# Patient Record
Sex: Male | Born: 1950 | Race: White | Hispanic: No | Marital: Married | Smoking: Former smoker
Health system: Southern US, Community
[De-identification: ages and names within clinical notes are randomized; demographics above are authoritative.]

## PROBLEM LIST (undated history)

## (undated) DIAGNOSIS — N189 Chronic kidney disease, unspecified: Secondary | ICD-10-CM

## (undated) DIAGNOSIS — E119 Type 2 diabetes mellitus without complications: Secondary | ICD-10-CM

## (undated) DIAGNOSIS — I10 Essential (primary) hypertension: Secondary | ICD-10-CM

## (undated) DIAGNOSIS — E785 Hyperlipidemia, unspecified: Secondary | ICD-10-CM

## (undated) HISTORY — DX: Chronic kidney disease, unspecified: N18.9

## (undated) HISTORY — DX: Essential (primary) hypertension: I10

## (undated) HISTORY — DX: Type 2 diabetes mellitus without complications: E11.9

## (undated) HISTORY — DX: Hyperlipidemia, unspecified: E78.5

---

## 2020-06-01 ENCOUNTER — Encounter: Payer: Self-pay | Admitting: Family Medicine

## 2020-06-01 ENCOUNTER — Other Ambulatory Visit: Payer: Self-pay

## 2020-06-01 ENCOUNTER — Ambulatory Visit (INDEPENDENT_AMBULATORY_CARE_PROVIDER_SITE_OTHER): Payer: Medicare HMO | Admitting: Family Medicine

## 2020-06-01 DIAGNOSIS — E1121 Type 2 diabetes mellitus with diabetic nephropathy: Secondary | ICD-10-CM

## 2020-06-01 DIAGNOSIS — N183 Chronic kidney disease, stage 3 unspecified: Secondary | ICD-10-CM | POA: Insufficient documentation

## 2020-06-01 DIAGNOSIS — E785 Hyperlipidemia, unspecified: Secondary | ICD-10-CM

## 2020-06-01 DIAGNOSIS — N1832 Chronic kidney disease, stage 3b: Secondary | ICD-10-CM | POA: Diagnosis not present

## 2020-06-01 DIAGNOSIS — R21 Rash and other nonspecific skin eruption: Secondary | ICD-10-CM

## 2020-06-01 DIAGNOSIS — R351 Nocturia: Secondary | ICD-10-CM | POA: Diagnosis not present

## 2020-06-01 DIAGNOSIS — I10 Essential (primary) hypertension: Secondary | ICD-10-CM | POA: Diagnosis not present

## 2020-06-01 DIAGNOSIS — E119 Type 2 diabetes mellitus without complications: Secondary | ICD-10-CM | POA: Insufficient documentation

## 2020-06-01 DIAGNOSIS — E1122 Type 2 diabetes mellitus with diabetic chronic kidney disease: Secondary | ICD-10-CM

## 2020-06-01 DIAGNOSIS — E559 Vitamin D deficiency, unspecified: Secondary | ICD-10-CM | POA: Insufficient documentation

## 2020-06-01 MED ORDER — BENAZEPRIL HCL 10 MG PO TABS
10.0000 mg | ORAL_TABLET | Freq: Every day | ORAL | 1 refills | Status: DC
Start: 2020-06-01 — End: 2020-06-13

## 2020-06-01 NOTE — Progress Notes (Signed)
Office Visit Note   Patient: Craig Baldwin           Date of Birth: 11-29-1951           MRN: [id number] Visit Date: 06/01/2020 Requested by: No referring provider defined for this encounter. PCP: No primary care provider on file.  Subjective: Chief Complaint  Patient presents with  . Chronic Kidney Disease    stage 3 kidney disease, make sure he is on the meds that he should be taking  . Skin Problem    areas that start itching and he has to scratch them, uses ammonium lactate lotion 12% and triamcinolone Acetonide 0.1%-on hands and arms    HPI: He is here to establish care.  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  He wanted to meet with me to discuss his health and different ways to manage with lifestyle.  He has been diabetic for quite a while.  Diabetes has been well controlled.  He was on Metformin for many years but then in 2016 he developed chronic kidney disease stage III and has been off Metformin.  He is managing with diet and his most recent A1c was 5.8.  No nephropathy or retinopathy.  He has been on benazepril primarily for renal protection rather than hypertension.  He has been on this for many years as well.  He was started on a statin last year but was concerned about side effects so he stopped it about 4 months ago.  He has developed a rash on his arms.  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  A dermatologist gave him triamcinolone and ammonium lactate creams for this.  From a dietary standpoint he has tried to eliminate grains as much as possible.  He was able to lose weight quite a bit until Thanksgiving and then he "fell off the wagon".  He struggles with nocturia as well as daytime urinary frequency.  Flomax has not really helped.  He has vitamin D deficiency and is not currently being treated for  that.               ROS:   All other systems were reviewed and are negative.  Objective: Vital Signs: BP 115/70 (BP Location: Left Arm, Patient Position: Sitting)   Pulse 60   Ht 5\' 11"  (1.803 m)   Wt 225 lb 12.8 oz (102.4 kg)   BMI 31.49 kg/m   Physical Exam:  General:  Alert and oriented, in no acute distress. Pulm:  Breathing unlabored. Psy:  Normal mood, congruent affect. Skin: There is some bruising on his forearms on the dorsum.  Legs have no rash. Neck: No thyromegaly or nodules, no carotid bruits. CV: Regular rate and rhythm without murmurs, rubs, or gallops.  No peripheral edema.  2+ radial and posterior tibial pulses. Lungs: Clear to auscultation throughout with no wheezing or areas of consolidation. Abd: Bowel sounds are active, no hepatosplenomegaly or masses.  Soft and nontender.  No audible bruits.  No evidence of ascites. Extremities: No nail deformities.  2+ DTRs.   Imaging: No results found.  Assessment & Plan: 1.  Diabetes, has been well controlled -Labs to evaluate.  2.  Possible hypertension -Blood pressure normal on benazepril  3.  Chronic kidney disease stage III -Avoid NSAIDs.  Recheck levels today.  4.  Hyperlipidemia -  We will check a fractionated lipid profile.  5.  Vitamin D deficiency -Recheck levels today.  6.  Rash on forearms -We will try a food elimination diet.     Procedures: No procedures performed  No notes on file     PMFS History: Patient Active Problem List   Diagnosis Date Noted  . Diabetes ([place]) 06/01/2020  . CKD (chronic kidney disease), stage III 06/01/2020  . Nocturia 06/01/2020  . Essential hypertension 06/01/2020  . Hyperlipidemia 06/01/2020  . Rash 06/01/2020  . Vitamin D deficiency 06/01/2020   History reviewed. No pertinent past medical history.  History reviewed. No pertinent family history.  History reviewed. No pertinent surgical history. Social History   Occupational History  . Not on file    Tobacco Use  . Smoking status: Not on file  Substance and Sexual Activity  . Alcohol use: Not on file  . Drug use: Not on file  . Sexual activity: Not on file

## 2020-06-04 ENCOUNTER — Telehealth: Payer: Self-pay | Admitting: Family Medicine

## 2020-06-04 LAB — COMPREHENSIVE METABOLIC PANEL
AG Ratio: 1.7 (calc) (ref 1.0–2.5)
ALT: 11 U/L (ref 9–46)
AST: 14 U/L (ref 10–35)
Albumin: 4.3 g/dL (ref 3.6–5.1)
Alkaline phosphatase (APISO): 52 U/L (ref 35–144)
BUN/Creatinine Ratio: 17 (calc) (ref 6–22)
BUN: 40 mg/dL — ABNORMAL HIGH (ref 7–25)
CO2: 27 mmol/L (ref 20–32)
Calcium: 9.5 mg/dL (ref 8.6–10.3)
Chloride: 104 mmol/L (ref 98–110)
Creat: 2.39 mg/dL — ABNORMAL HIGH (ref 0.70–1.25)
Globulin: 2.6 g/dL (calc) (ref 1.9–3.7)
Glucose, Bld: 108 mg/dL — ABNORMAL HIGH (ref 65–99)
Potassium: 5.1 mmol/L (ref 3.5–5.3)
Sodium: 137 mmol/L (ref 135–146)
Total Bilirubin: 0.8 mg/dL (ref 0.2–1.2)
Total Protein: 6.9 g/dL (ref 6.1–8.1)

## 2020-06-04 LAB — VITAMIN D 25 HYDROXY (VIT D DEFICIENCY, FRACTURES): Vit D, 25-Hydroxy: 32 ng/mL (ref 30–100)

## 2020-06-04 LAB — CBC WITH DIFFERENTIAL/PLATELET
Absolute Monocytes: 552 cells/uL (ref 200–950)
Basophils Absolute: 28 cells/uL (ref 0–200)
Basophils Relative: 0.4 %
Eosinophils Absolute: 228 cells/uL (ref 15–500)
Eosinophils Relative: 3.3 %
HCT: 39.5 % (ref 38.5–50.0)
Hemoglobin: 13.6 g/dL (ref 13.2–17.1)
Lymphs Abs: 1960 cells/uL (ref 850–3900)
MCH: 31 pg (ref 27.0–33.0)
MCHC: 34.4 g/dL (ref 32.0–36.0)
MCV: 90 fL (ref 80.0–100.0)
MPV: 10.2 fL (ref 7.5–12.5)
Monocytes Relative: 8 %
Neutro Abs: 4133 cells/uL (ref 1500–7800)
Neutrophils Relative %: 59.9 %
Platelets: 201 10*3/uL (ref 140–400)
RBC: 4.39 10*6/uL (ref 4.20–5.80)
RDW: 12.7 % (ref 11.0–15.0)
Total Lymphocyte: 28.4 %
WBC: 6.9 10*3/uL (ref 3.8–10.8)

## 2020-06-04 LAB — PSA: PSA: 0.7 ng/mL (ref <=4.0)

## 2020-06-04 LAB — HEMOGLOBIN A1C
Hgb A1c MFr Bld: 6.4 % of total Hgb — ABNORMAL HIGH (ref <5.7)
Mean Plasma Glucose: 137 (calc)
eAG (mmol/L): 7.6 (calc)

## 2020-06-04 LAB — THYROID PANEL WITH TSH
Free Thyroxine Index: 1.8 (ref 1.4–3.8)
T3 Uptake: 31 % (ref 22–35)
T4, Total: 5.7 ug/dL (ref 4.9–10.5)
TSH: 1.52 mIU/L (ref 0.40–4.50)

## 2020-06-04 LAB — TEST IN QUESTION-CARDIO IQ™ MISC QUEST

## 2020-06-04 LAB — ANA: Anti Nuclear Antibody (ANA): NEGATIVE

## 2020-06-04 NOTE — Telephone Encounter (Signed)
Labs are notable for the following:  Previous creatinine level in November was 1.89 with a GFR of 36.  Current creatinine is 2.39.  GFR was not calculated.  We need to recheck this and 2 to 3 months to be sure it comes back down again.  Previous hemoglobin A1c was 5.8.  Current level is worse at 6.4.  The fractionated lipid profile results are not yet available.  We will check on this.  I sent him more dietary information by email.

## 2020-06-04 NOTE — Telephone Encounter (Signed)
IC s/w patient and advised per Dr Junius Roads. Patient verbalized understanding. He will contact our office in the next 2-3 months to schedule an appointment for repeat lab work.

## 2020-06-04 NOTE — Telephone Encounter (Signed)
Call patient with results

## 2020-06-07 ENCOUNTER — Telehealth: Payer: Self-pay

## 2020-06-07 MED ORDER — TAMSULOSIN HCL 0.4 MG PO CAPS: 0.4000 mg | ORAL_CAPSULE | Freq: Every day | ORAL | 6 refills | Status: DC

## 2020-06-07 NOTE — Telephone Encounter (Signed)
Patient called in wanting to speak about medications.

## 2020-06-07 NOTE — Telephone Encounter (Signed)
Rx sent for Flomax.  If this doesn't help, I'll refer him to urologist.

## 2020-06-07 NOTE — Telephone Encounter (Signed)
States the [place] pal[place] has greatly reduced his flow of urine

## 2020-06-07 NOTE — Telephone Encounter (Signed)
Patient aware of the below message  

## 2020-06-13 ENCOUNTER — Other Ambulatory Visit: Payer: Self-pay | Admitting: Family Medicine

## 2020-06-13 MED ORDER — BENAZEPRIL HCL 10 MG PO TABS
10.0000 mg | ORAL_TABLET | Freq: Every day | ORAL | 1 refills | Status: DC
Start: 2020-06-13 — End: 2021-04-19

## 2020-12-03 ENCOUNTER — Encounter: Payer: Self-pay | Admitting: Family Medicine

## 2020-12-03 ENCOUNTER — Ambulatory Visit (INDEPENDENT_AMBULATORY_CARE_PROVIDER_SITE_OTHER): Payer: Medicare HMO | Admitting: Family Medicine

## 2020-12-03 ENCOUNTER — Other Ambulatory Visit: Payer: Self-pay

## 2020-12-03 VITALS — BP 152/72 | HR 66 | Ht 71.0 in | Wt 237.0 lb

## 2020-12-03 DIAGNOSIS — I10 Essential (primary) hypertension: Secondary | ICD-10-CM | POA: Diagnosis not present

## 2020-12-03 DIAGNOSIS — E785 Hyperlipidemia, unspecified: Secondary | ICD-10-CM | POA: Diagnosis not present

## 2020-12-03 DIAGNOSIS — N1832 Chronic kidney disease, stage 3b: Secondary | ICD-10-CM

## 2020-12-03 DIAGNOSIS — E1122 Type 2 diabetes mellitus with diabetic chronic kidney disease: Secondary | ICD-10-CM | POA: Diagnosis not present

## 2020-12-03 NOTE — Progress Notes (Signed)
   Office Visit Note   Patient: Craig Baldwin           Date of Birth: 10-Sep-1951           MRN: [id number] Visit Date: 12/03/2020 Requested by: No referring provider defined for this encounter. PCP: Eunice Blase, MD  Subjective: Chief Complaint  Patient presents with  . Blood Pressure Check  . Diabetes Mellitus    HPI: He is here for follow-up diabetes and hypertension.  Since last visit he quit chewing tobacco and gained a little bit of weight.  He is pleased with his tobacco cessation.  He also saw nephrology and his creatinine had improved slightly.  Denies headaches, chest pain, palpitations, shortness of breath.  He does have some occasional peripheral edema.  He has occasional tightness in his feet but no definite numbness, no sores.                ROS:   All other systems were reviewed and are negative.  Objective: Vital Signs: BP (!) 152/72   Pulse 66   Ht 5\' 11"  (1.803 m)   Wt 237 lb (107.5 kg)   BMI 33.05 kg/m   Physical Exam:  General:  Alert and oriented, in no acute distress. Pulm:  Breathing unlabored. Psy:  Normal mood, congruent affect. Skin: He has slightly diminished sharp/dull sensation on the plantar aspect of both first MTP joints.  Remainder of sensation is normal, no hypertrophic calluses, no skin breakdown. CV: Regular rate and rhythm without murmurs, rubs, or gallops.  No peripheral edema.  2+ radial and posterior tibial pulses. Lungs: Clear to auscultation throughout with no wheezing or areas of consolidation.    Imaging: No results found.  Assessment & Plan: 1.  Diabetes, has been stable. -A1c and microalbumin today.  Follow-up in 6 months.  2.  Hypertension, suboptimally controlled today. -No changes made.  He is monitoring blood pressure periodically and it has been better elsewhere.     Procedures: No procedures performed        PMFS History: Patient Active Problem List   Diagnosis Date Noted  . Diabetes ([place])  06/01/2020  . CKD (chronic kidney disease), stage III ([employer]) 06/01/2020  . Nocturia 06/01/2020  . Essential hypertension 06/01/2020  . Hyperlipidemia 06/01/2020  . Rash 06/01/2020  . Vitamin D deficiency 06/01/2020   History reviewed. No pertinent past medical history.  History reviewed. No pertinent family history.  History reviewed. No pertinent surgical history. Social History   Occupational History  . Not on file  Tobacco Use  . Smoking status: Not on file  . Smokeless tobacco: Not on file  Substance and Sexual Activity  . Alcohol use: Not on file  . Drug use: Not on file  . Sexual activity: Not on file

## 2020-12-04 ENCOUNTER — Telehealth: Payer: Self-pay | Admitting: Family Medicine

## 2020-12-04 LAB — HEMOGLOBIN A1C
Hgb A1c MFr Bld: 7.2 % of total Hgb — ABNORMAL HIGH (ref <5.7)
Mean Plasma Glucose: 160 mg/dL
eAG (mmol/L): 8.9 mmol/L

## 2020-12-04 LAB — MICROALBUMIN, URINE: Microalb, Ur: 5.6 mg/dL

## 2020-12-04 NOTE — Telephone Encounter (Signed)
Urine microalbumin is negative, but A1C has risen to 7.2.  This is not surprising given that his diet changed (as we discussed yesterday), but hopefully over the next 6 months or so, he'll get the diet back in order and the numbers will improve again.    Recheck in about 6 months.

## 2020-12-04 NOTE — Telephone Encounter (Signed)
I called and advised the patient of his lab results. He asked about where he needed to go for compression stockings/socks, some place that had "reasonable" prices. I was not sure which place to which you were referring. The patient lives in [place].

## 2020-12-04 NOTE — Telephone Encounter (Signed)
I emailed some info to him.

## 2020-12-04 NOTE — Telephone Encounter (Signed)
I called the patient back and advised him -- he checked his email and it did come through on his end.

## 2021-02-25 ENCOUNTER — Ambulatory Visit: Payer: Medicare HMO | Admitting: Family Medicine

## 2021-02-25 ENCOUNTER — Telehealth: Payer: Self-pay | Admitting: Family Medicine

## 2021-02-25 DIAGNOSIS — R519 Headache, unspecified: Secondary | ICD-10-CM | POA: Diagnosis not present

## 2021-02-25 DIAGNOSIS — G4489 Other headache syndrome: Secondary | ICD-10-CM | POA: Diagnosis not present

## 2021-02-25 DIAGNOSIS — K118 Other diseases of salivary glands: Secondary | ICD-10-CM | POA: Diagnosis not present

## 2021-02-25 NOTE — Telephone Encounter (Signed)
Spoke with patient advised him [place] said he should go to an urgent care and be seen for the headache he is having on the left side of his head.  Patient said he can take a BC and the headache may be gone for 4 hours but return after that.  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  The number to contact patient is needed is [phone]

## 2021-02-25 NOTE — Telephone Encounter (Signed)
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 He should be evaluated before he drives such a distance, per Dr. Junius Roads.

## 2021-03-04 DIAGNOSIS — Z0100 Encounter for examination of eyes and vision without abnormal findings: Secondary | ICD-10-CM | POA: Diagnosis not present

## 2021-03-04 DIAGNOSIS — E119 Type 2 diabetes mellitus without complications: Secondary | ICD-10-CM | POA: Diagnosis not present

## 2021-03-04 LAB — HM DIABETES EYE EXAM

## 2021-04-18 ENCOUNTER — Other Ambulatory Visit: Payer: Self-pay | Admitting: Family Medicine

## 2021-04-22 DIAGNOSIS — E669 Obesity, unspecified: Secondary | ICD-10-CM | POA: Diagnosis not present

## 2021-04-22 DIAGNOSIS — N189 Chronic kidney disease, unspecified: Secondary | ICD-10-CM | POA: Diagnosis not present

## 2021-04-22 DIAGNOSIS — N183 Chronic kidney disease, stage 3 unspecified: Secondary | ICD-10-CM | POA: Diagnosis not present

## 2021-04-22 DIAGNOSIS — E211 Secondary hyperparathyroidism, not elsewhere classified: Secondary | ICD-10-CM | POA: Diagnosis not present

## 2021-04-22 DIAGNOSIS — R309 Painful micturition, unspecified: Secondary | ICD-10-CM | POA: Diagnosis not present

## 2021-04-22 DIAGNOSIS — E785 Hyperlipidemia, unspecified: Secondary | ICD-10-CM | POA: Diagnosis not present

## 2021-04-22 DIAGNOSIS — I10 Essential (primary) hypertension: Secondary | ICD-10-CM | POA: Diagnosis not present

## 2021-04-22 DIAGNOSIS — D649 Anemia, unspecified: Secondary | ICD-10-CM | POA: Diagnosis not present

## 2021-04-22 DIAGNOSIS — E1169 Type 2 diabetes mellitus with other specified complication: Secondary | ICD-10-CM | POA: Diagnosis not present

## 2021-04-22 DIAGNOSIS — E559 Vitamin D deficiency, unspecified: Secondary | ICD-10-CM | POA: Diagnosis not present

## 2021-04-22 DIAGNOSIS — R809 Proteinuria, unspecified: Secondary | ICD-10-CM | POA: Diagnosis not present

## 2021-07-18 ENCOUNTER — Other Ambulatory Visit: Payer: Self-pay | Admitting: Family Medicine

## 2021-07-25 ENCOUNTER — Other Ambulatory Visit: Payer: Self-pay | Admitting: Family Medicine

## 2021-07-29 ENCOUNTER — Telehealth: Payer: Self-pay | Admitting: Family Medicine

## 2021-07-29 MED ORDER — CLINDAMYCIN HCL 300 MG PO CAPS: 300.0000 mg | ORAL_CAPSULE | Freq: Three times a day (TID) | ORAL | 0 refills | Status: DC

## 2021-07-29 NOTE — Telephone Encounter (Signed)
Please advise on the possibility of getting an antibiotic for upper gum pain.

## 2021-07-29 NOTE — Telephone Encounter (Signed)
Patient called  asked he has upper top gum in the top of his mouth.  Patient asked if he can get an antibiotic called into his pharmacy? Patient uses [facility] [address] in [place].  Patient said the Dentist is out of the office today. The number to contact patient is [phone]

## 2021-07-29 NOTE — Telephone Encounter (Signed)
I called and advised the patient. 

## 2021-09-07 DIAGNOSIS — M79671 Pain in right foot: Secondary | ICD-10-CM | POA: Diagnosis not present

## 2021-09-07 DIAGNOSIS — I82431 Acute embolism and thrombosis of right popliteal vein: Secondary | ICD-10-CM | POA: Diagnosis not present

## 2021-09-07 DIAGNOSIS — M7989 Other specified soft tissue disorders: Secondary | ICD-10-CM | POA: Diagnosis not present

## 2021-09-09 ENCOUNTER — Other Ambulatory Visit: Payer: Self-pay

## 2021-09-09 ENCOUNTER — Telehealth: Payer: Self-pay | Admitting: Family Medicine

## 2021-09-09 ENCOUNTER — Encounter: Payer: Self-pay | Admitting: Family Medicine

## 2021-09-09 ENCOUNTER — Ambulatory Visit: Payer: Self-pay

## 2021-09-09 ENCOUNTER — Ambulatory Visit: Payer: Medicare HMO | Admitting: Family Medicine

## 2021-09-09 VITALS — Ht 71.0 in | Wt 240.0 lb

## 2021-09-09 DIAGNOSIS — M79671 Pain in right foot: Secondary | ICD-10-CM

## 2021-09-09 DIAGNOSIS — M7671 Peroneal tendinitis, right leg: Secondary | ICD-10-CM | POA: Diagnosis not present

## 2021-09-09 MED ORDER — PREDNISONE 5 MG PO TABS: ORAL_TABLET | ORAL | 0 refills | Status: DC

## 2021-09-09 NOTE — Progress Notes (Signed)
.   Craig Baldwin - 70 y.o. male MRN [id number]  Date of birth: [birth year]  SUBJECTIVE:  Including CC & ROS.  No chief complaint on file.   Craig Baldwin is a 70 y.o. male that is presenting with right foot pain.  The pain is been ongoing for a few days.  Denies any swelling or redness of his right calf.  Was evaluated in outside facility and told he had a blood clot.  He was started on Xarelto but his symptoms have continued..  Review of the duplex from 9/17 shows a nearly occlusive thrombus seen within the popliteal vein.   Review of Systems See HPI   HISTORY: Past Medical, Surgical, Social, and Family History Reviewed & Updated per EMR.   Pertinent Historical Findings include:  History reviewed. No pertinent past medical history.  History reviewed. No pertinent surgical history.  History reviewed. No pertinent family history.  Social History   Socioeconomic History   Marital status: Married    Spouse name: Not on file   Number of children: Not on file   Years of education: Not on file   Highest education level: Not on file  Occupational History   Not on file  Tobacco Use   Smoking status: Not on file   Smokeless tobacco: Not on file  Substance and Sexual Activity   Alcohol use: Not on file   Drug use: Not on file   Sexual activity: Not on file  Other Topics Concern   Not on file  Social History Narrative   Not on file   Social Determinants of Health   Financial Resource Strain: Not on file  Food Insecurity: Not on file  Transportation Needs: Not on file  Physical Activity: Not on file  Stress: Not on file  Social Connections: Not on file  Intimate Partner Violence: Not on file     PHYSICAL EXAM:  VS: Ht '5\' 11"'$  (1.803 m)   Wt 240 lb (108.9 kg)   BMI 33.47 kg/m  Physical Exam Gen: NAD, alert, cooperative with exam, well-appearing   Limited ultrasound: Right foot:  No ankle effusion. Normal-appearing posterior tibialis. Encircling effusion  appreciated as the peroneal tendons passing peer to the lateral malleolus. Normal insertion of the peroneal brevis into the base of the fifth metatarsal  Summary: Peroneal tendinitis  Ultrasound and interpretation by Clearance Coots, MD     ASSESSMENT & PLAN:   Peroneal tendinitis of lower leg, right Has an encircling effusion of the peroneal tendons.  Seems that his symptoms are more consistent with a tendinitis as opposed to the findings that were observed on the duplex study on 9/17. -Counseled on home exercise therapy and supportive care. -Prednisone. -May need referral to hematology to discuss stopping the Xarelto as it seems that the findings on duplex were incidental in nature and not consistent with his symptoms

## 2021-09-09 NOTE — Assessment & Plan Note (Signed)
Has an encircling effusion of the peroneal tendons.  Seems that his symptoms are more consistent with a tendinitis as opposed to the findings that were observed on the duplex study on 9/17. -Counseled on home exercise therapy and supportive care. -Prednisone. -May need referral to hematology to discuss stopping the Xarelto as it seems that the findings on duplex were incidental in nature and not consistent with his symptoms

## 2021-09-09 NOTE — Patient Instructions (Signed)
Nice to meet you Please try the exercises  I will call with the plan for the xarelto   Please send me a message in MyChart with any questions or updates.  Please see me back in 3 weeks.   --Dr. Raeford Razor

## 2021-09-09 NOTE — Telephone Encounter (Signed)
Answered patient's questions.   Rosemarie Ax, MD [facility] 09/09/2021, 5:21 PM

## 2021-09-12 ENCOUNTER — Telehealth: Payer: Self-pay | Admitting: Family Medicine

## 2021-09-12 NOTE — Telephone Encounter (Signed)
Informed that given that there was what appears the beginnings of a clot on his duplex then we will continue the xarelto for three months.   Rosemarie Ax, MD [facility] 09/12/2021, 1:27 PM

## 2021-09-12 NOTE — Telephone Encounter (Signed)
Patient called says waiting on advice from Dr. Raeford Razor re: treatment plan & is blood thinner meds

## 2021-09-30 ENCOUNTER — Telehealth: Payer: Self-pay | Admitting: Family Medicine

## 2021-09-30 NOTE — Telephone Encounter (Signed)
Patient called states his Dentist is concerned of him taking Blood Thinners for the leg (bloodclot)  --Pt says is scheduled to have exploratory root canal or extractions on  his Thursday 10/03/21 and the dentist is very concerned that he is still on the blood thinner... Please advise .  Call pt [phone]  -glh

## 2021-09-30 NOTE — Telephone Encounter (Signed)
Left VM for patient. If he calls back please have him speak with a nurse/CMA and inform that he can hold the anticoagulation the day before the day after that and resume.   If any questions then please take the best time and phone number to call and I will try to call him back.   Rosemarie Ax, MD [facility] 09/30/2021, 4:07 PM

## 2021-10-08 ENCOUNTER — Ambulatory Visit: Payer: Medicare HMO | Admitting: Family Medicine

## 2021-10-08 ENCOUNTER — Encounter: Payer: Self-pay | Admitting: Family Medicine

## 2021-10-08 VITALS — Ht 71.0 in | Wt 240.0 lb

## 2021-10-08 DIAGNOSIS — E1122 Type 2 diabetes mellitus with diabetic chronic kidney disease: Secondary | ICD-10-CM

## 2021-10-08 DIAGNOSIS — N1832 Chronic kidney disease, stage 3b: Secondary | ICD-10-CM | POA: Diagnosis not present

## 2021-10-08 DIAGNOSIS — M7671 Peroneal tendinitis, right leg: Secondary | ICD-10-CM | POA: Diagnosis not present

## 2021-10-08 DIAGNOSIS — I82431 Acute embolism and thrombosis of right popliteal vein: Secondary | ICD-10-CM

## 2021-10-08 MED ORDER — APIXABAN 5 MG PO TABS: 5.0000 mg | ORAL_TABLET | Freq: Two times a day (BID) | ORAL | 0 refills | Status: DC

## 2021-10-08 NOTE — Patient Instructions (Signed)
Good to see you  Please start the eliquis. This will need to be continued for three months.  Please check with [facility] across the haul about establishing care  We'll call with results from today.  Please send me a message in MyChart with any questions or updates.  Please see me back in 6-8 weeks.   --Dr. Raeford Razor

## 2021-10-08 NOTE — Assessment & Plan Note (Signed)
Has resolved with medication and no longer feels any pain. -Counseled on home exercise therapy and supportive care. -Could consider physical therapy.

## 2021-10-08 NOTE — Assessment & Plan Note (Signed)
Uncontrolled. -Hemoglobin A1c, lipid panel

## 2021-10-08 NOTE — Progress Notes (Signed)
  Craig Baldwin - 70 y.o. male MRN [id number]  Date of birth: 09/16/1951  SUBJECTIVE:  Including CC & ROS.  No chief complaint on file.   Craig Baldwin is a 70 y.o. male that is following up for his right leg pain and his DVT.  Has not been able to establish with a new physician.  His right lower leg pain has improved with the prednisone.  He has continued the Xarelto but he ran out on Sunday night.  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    Review of Systems See HPI   HISTORY: Past Medical, Surgical, Social, and Family History Reviewed & Updated per EMR.   Pertinent Historical Findings include:  History reviewed. No pertinent past medical history.  History reviewed. No pertinent surgical history.  History reviewed. No pertinent family history.  Social History   Socioeconomic History   Marital status: Married    Spouse name: Not on file   Number of children: Not on file   Years of education: Not on file   Highest education level: Not on file  Occupational History   Not on file  Tobacco Use   Smoking status: Not on file   Smokeless tobacco: Not on file  Substance and Sexual Activity   Alcohol use: Not on file   Drug use: Not on file   Sexual activity: Not on file  Other Topics Concern   Not on file  Social History Narrative   Not on file   Social Determinants of Health   Financial Resource Strain: Not on file  Food Insecurity: Not on file  Transportation Needs: Not on file  Physical Activity: Not on file  Stress: Not on file  Social Connections: Not on file  Intimate Partner Violence: Not on file     PHYSICAL EXAM:  VS: Ht 5\' 11"  (1.803 m)   Wt 240 lb (108.9 kg)   BMI 33.47 kg/m  Physical Exam Gen: NAD, alert, cooperative with exam, well-appearing      ASSESSMENT & PLAN:   Peroneal tendinitis of lower leg, right Has resolved with medication and no longer feels any pain. -Counseled on home exercise therapy and  supportive care. -Could consider physical therapy.  Diabetes ([place]) Uncontrolled. -Hemoglobin A1c, lipid panel  Acute deep vein thrombosis (DVT) of popliteal vein of right lower extremity (HCC) Doppler on 9/17 was showing a nonocclusive formation in the popliteal vein.  Has been treated with Xarelto at that initial visit.  No specific identifying source.  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  We will treat for 3 months due to unprovoked DVT and switched to Eliquis given his kidney function -CMP  CKD (chronic kidney disease), stage III Check CMP today.

## 2021-10-08 NOTE — Assessment & Plan Note (Signed)
Check CMP today 

## 2021-10-08 NOTE — Assessment & Plan Note (Signed)
Doppler on 9/17 was showing a nonocclusive formation in the popliteal vein.  Has been treated with Xarelto at that initial visit.  No specific identifying source.  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  We will treat for 3 months due to unprovoked DVT and switched to Eliquis given his kidney function -CMP

## 2021-10-09 ENCOUNTER — Telehealth: Payer: Self-pay | Admitting: Family Medicine

## 2021-10-09 DIAGNOSIS — E1122 Type 2 diabetes mellitus with diabetic chronic kidney disease: Secondary | ICD-10-CM

## 2021-10-09 LAB — COMPREHENSIVE METABOLIC PANEL
ALT: 20 IU/L (ref 0–44)
AST: 14 IU/L (ref 0–40)
Albumin/Globulin Ratio: 2 (ref 1.2–2.2)
Albumin: 4.2 g/dL (ref 3.8–4.8)
Alkaline Phosphatase: 76 IU/L (ref 44–121)
BUN/Creatinine Ratio: 11 (ref 10–24)
BUN: 23 mg/dL (ref 8–27)
Bilirubin Total: 0.3 mg/dL (ref 0.0–1.2)
CO2: 19 mmol/L — ABNORMAL LOW (ref 20–29)
Calcium: 9.7 mg/dL (ref 8.6–10.2)
Chloride: 100 mmol/L (ref 96–106)
Creatinine, Ser: 2.11 mg/dL — ABNORMAL HIGH (ref 0.76–1.27)
Globulin, Total: 2.1 g/dL (ref 1.5–4.5)
Glucose: 279 mg/dL — ABNORMAL HIGH (ref 70–99)
Potassium: 4.9 mmol/L (ref 3.5–5.2)
Sodium: 135 mmol/L (ref 134–144)
Total Protein: 6.3 g/dL (ref 6.0–8.5)
eGFR: 33 mL/min/{1.73_m2} — ABNORMAL LOW (ref >59)

## 2021-10-09 LAB — LIPID PANEL
Chol/HDL Ratio: 5.7 ratio — ABNORMAL HIGH (ref 0.0–5.0)
Cholesterol, Total: 159 mg/dL (ref 100–199)
HDL: 28 mg/dL — ABNORMAL LOW (ref >39)
LDL Chol Calc (NIH): 95 mg/dL (ref 0–99)
Triglycerides: 206 mg/dL — ABNORMAL HIGH (ref 0–149)
VLDL Cholesterol Cal: 36 mg/dL (ref 5–40)

## 2021-10-09 LAB — HEMOGLOBIN A1C
Est. average glucose Bld gHb Est-mCnc: 252 mg/dL
Hgb A1c MFr Bld: 10.4 % — ABNORMAL HIGH (ref 4.8–5.6)

## 2021-10-09 MED ORDER — GLIMEPIRIDE 1 MG PO TABS: 1.0000 mg | ORAL_TABLET | Freq: Every day | ORAL | 1 refills | Status: DC

## 2021-10-09 NOTE — Telephone Encounter (Signed)
Pt informed of below.    Please inform i'm start a different medication since metformin isn't indicated with his kidney function. I'm making a referral to the diabetes physician at the [facility] location.

## 2021-10-29 DIAGNOSIS — E669 Obesity, unspecified: Secondary | ICD-10-CM | POA: Diagnosis not present

## 2021-10-29 DIAGNOSIS — N183 Chronic kidney disease, stage 3 unspecified: Secondary | ICD-10-CM | POA: Diagnosis not present

## 2021-10-29 DIAGNOSIS — I10 Essential (primary) hypertension: Secondary | ICD-10-CM | POA: Diagnosis not present

## 2021-10-29 DIAGNOSIS — E785 Hyperlipidemia, unspecified: Secondary | ICD-10-CM | POA: Diagnosis not present

## 2021-10-29 DIAGNOSIS — R309 Painful micturition, unspecified: Secondary | ICD-10-CM | POA: Diagnosis not present

## 2021-10-29 DIAGNOSIS — E1169 Type 2 diabetes mellitus with other specified complication: Secondary | ICD-10-CM | POA: Diagnosis not present

## 2021-10-29 DIAGNOSIS — R809 Proteinuria, unspecified: Secondary | ICD-10-CM | POA: Diagnosis not present

## 2021-10-30 ENCOUNTER — Other Ambulatory Visit: Payer: Self-pay

## 2021-10-31 ENCOUNTER — Telehealth: Payer: Self-pay | Admitting: *Deleted

## 2021-10-31 ENCOUNTER — Ambulatory Visit (INDEPENDENT_AMBULATORY_CARE_PROVIDER_SITE_OTHER): Payer: Medicare HMO | Admitting: Medical

## 2021-10-31 ENCOUNTER — Encounter: Payer: Self-pay | Admitting: Medical

## 2021-10-31 ENCOUNTER — Telehealth: Payer: Self-pay

## 2021-10-31 VITALS — BP 134/60 | HR 58 | Temp 98.1°F | Resp 18 | Ht 71.0 in | Wt 239.6 lb

## 2021-10-31 DIAGNOSIS — N183 Chronic kidney disease, stage 3 unspecified: Secondary | ICD-10-CM | POA: Diagnosis not present

## 2021-10-31 DIAGNOSIS — E785 Hyperlipidemia, unspecified: Secondary | ICD-10-CM

## 2021-10-31 DIAGNOSIS — E782 Mixed hyperlipidemia: Secondary | ICD-10-CM | POA: Diagnosis not present

## 2021-10-31 DIAGNOSIS — E1169 Type 2 diabetes mellitus with other specified complication: Secondary | ICD-10-CM

## 2021-10-31 DIAGNOSIS — I82431 Acute embolism and thrombosis of right popliteal vein: Secondary | ICD-10-CM | POA: Diagnosis not present

## 2021-10-31 DIAGNOSIS — I10 Essential (primary) hypertension: Secondary | ICD-10-CM | POA: Diagnosis not present

## 2021-10-31 DIAGNOSIS — Z23 Encounter for immunization: Secondary | ICD-10-CM

## 2021-10-31 MED ORDER — ATORVASTATIN CALCIUM 10 MG PO TABS: 10.0000 mg | ORAL_TABLET | Freq: Every day | ORAL | 3 refills | Status: DC

## 2021-10-31 MED ORDER — CANAGLIFLOZIN 100 MG PO TABS: 100.0000 mg | ORAL_TABLET | Freq: Every day | ORAL | 3 refills | Status: DC

## 2021-10-31 NOTE — Telephone Encounter (Signed)
Per referral Dr. Wylie Hail  - Called and lvm for call back to schedule New heme appointment.

## 2021-10-31 NOTE — Telephone Encounter (Signed)
PA for Invokana 1000 mg started today     Key code: [id number]

## 2021-10-31 NOTE — Progress Notes (Signed)
Subjective:    Patient ID: Craig Baldwin, male    DOB: 07-16-1951, 70 y.o.   MRN: [id number]  HPI  Pt in for first time.   Pt has diabetes, dvt and bph.  Pt had dvt was dx on 09-07-2021.   Clinical Impression:  1. Acute deep vein thrombosis (DVT) of popliteal vein of right lower extremity (HCC)    In ED he was given xarelo. Sports Medicine rx'd eliquis and pt no longer on xarelto.  Pt only had one dvt in the past.   Radiology [place] report from [place] states.   Popliteal vein: Nearly occlusive thrombus is seen within the popliteal vein on grayscale and color Doppler images.    Pt has diabetes. He is on glimepiride. 1 mg daily. Pt most recent a1c was 10.4.   Hyperlpidemia. On most recent labs. Pt used to be on statin. He stopped taking statin about one year ago. Pt heard statins.But he never had side effects.  Ckd- pt went nephrologist. Pt in past stopped metformin due to kidney concerns. Pt never told not to take metformin by pcp or nephrologist.   The 10-year ASCVD risk score (Arnett DK, et al., 2019) is: 43.5%   Values used to calculate the score:     Age: 18 years     Sex: Male     Is Non-Hispanic African American: No     Diabetic: Yes     Tobacco smoker: No     Systolic Blood Pressure: 175 mmHg     Is BP treated: Yes     HDL Cholesterol: 28 mg/dL     Total Cholesterol: 159 mg/dL   No past medical history on file.   Social History   Socioeconomic History   Marital status: Married    Spouse name: Not on file   Number of children: Not on file   Years of education: Not on file   Highest education level: Not on file  Occupational History   Not on file  Tobacco Use   Smoking status: Not on file   Smokeless tobacco: Not on file  Substance and Sexual Activity   Alcohol use: Not on file   Drug use: Not on file   Sexual activity: Not on file  Other Topics Concern   Not on file  Social History Narrative   Not on file   Social Determinants of Health    Financial Resource Strain: Not on file  Food Insecurity: Not on file  Transportation Needs: Not on file  Physical Activity: Not on file  Stress: Not on file  Social Connections: Not on file  Intimate Partner Violence: Not on file    No past surgical history on file.  No family history on file.  Allergies  Allergen Reactions   Amoxicillin Itching    Current Outpatient Medications on File Prior to Visit  Medication Sig Dispense Refill   apixaban (ELIQUIS) 5 MG TABS tablet Take 1 tablet (5 mg total) by mouth 2 (two) times daily. 180 tablet 0   Barberry-Oreg Grape-Goldenseal (BERBERINE COMPLEX PO) Take 1,500 mg by mouth in the morning and at bedtime.     benazepril (LOTENSIN) 10 MG tablet Take 1 tablet by mouth once daily 90 tablet 1   CINNAMON PO Take by mouth 2 (two) times daily.     clindamycin (CLEOCIN) 300 MG capsule Take 1 capsule (300 mg total) by mouth 3 (three) times daily. 21 capsule 0   glimepiride (AMARYL) 1 MG tablet Take 1 tablet (  1 mg total) by mouth daily with breakfast. 30 tablet 1   OVER THE COUNTER MEDICATION      predniSONE (DELTASONE) 5 MG tablet Take 6 pills for first day, 5 pills second day, 4 pills third day, 3 pills fourth day, 2 pills the fifth day, and 1 pill sixth day. 21 tablet 0   tamsulosin (FLOMAX) 0.4 MG CAPS capsule Take 1 capsule (0.4 mg total) by mouth daily after supper. 30 capsule 6   No current facility-administered medications on file prior to visit.    BP 134/60   Pulse (!) 58   Temp 98.1 F (36.7 C)   Resp 18   Ht 5\' 11"  (1.803 m)   Wt 239 lb 9.6 oz (108.7 kg)   SpO2 97%   BMI 33.42 kg/m     Review of Systems  Constitutional:  Negative for chills, fatigue and fever.  Respiratory:  Negative for cough, chest tightness, shortness of breath and wheezing.   Cardiovascular:  Negative for chest pain and palpitations.  Gastrointestinal:  Negative for abdominal pain, blood in stool and diarrhea.  Genitourinary:  Negative for  difficulty urinating, enuresis, flank pain, frequency and genital sores.  Musculoskeletal:  Negative for back pain, joint swelling and myalgias.     No past medical history on file.   Social History   Socioeconomic History   Marital status: Married    Spouse name: Not on file   Number of children: Not on file   Years of education: Not on file   Highest education level: Not on file  Occupational History   Not on file  Tobacco Use   Smoking status: Not on file   Smokeless tobacco: Not on file  Substance and Sexual Activity   Alcohol use: Not on file   Drug use: Not on file   Sexual activity: Not on file  Other Topics Concern   Not on file  Social History Narrative   Not on file   Social Determinants of Health   Financial Resource Strain: Not on file  Food Insecurity: Not on file  Transportation Needs: Not on file  Physical Activity: Not on file  Stress: Not on file  Social Connections: Not on file  Intimate Partner Violence: Not on file    No past surgical history on file.  No family history on file.  Allergies  Allergen Reactions   Amoxicillin Itching    Current Outpatient Medications on File Prior to Visit  Medication Sig Dispense Refill   apixaban (ELIQUIS) 5 MG TABS tablet Take 1 tablet (5 mg total) by mouth 2 (two) times daily. 180 tablet 0   Barberry-Oreg Grape-Goldenseal (BERBERINE COMPLEX PO) Take 1,500 mg by mouth in the morning and at bedtime.     benazepril (LOTENSIN) 10 MG tablet Take 1 tablet by mouth once daily 90 tablet 1   CINNAMON PO Take by mouth 2 (two) times daily.     clindamycin (CLEOCIN) 300 MG capsule Take 1 capsule (300 mg total) by mouth 3 (three) times daily. 21 capsule 0   glimepiride (AMARYL) 1 MG tablet Take 1 tablet (1 mg total) by mouth daily with breakfast. 30 tablet 1   OVER THE COUNTER MEDICATION      predniSONE (DELTASONE) 5 MG tablet Take 6 pills for first day, 5 pills second day, 4 pills third day, 3 pills fourth day, 2  pills the fifth day, and 1 pill sixth day. 21 tablet 0   tamsulosin (FLOMAX) 0.4 MG CAPS capsule  Take 1 capsule (0.4 mg total) by mouth daily after supper. 30 capsule 6   No current facility-administered medications on file prior to visit.    BP 134/60   Pulse (!) 58   Temp 98.1 F (36.7 C)   Resp 18   Ht 5\' 11"  (1.803 m)   Wt 239 lb 9.6 oz (108.7 kg)   SpO2 97%   BMI 33.42 kg/m        Objective:   Physical Exam  General Mental Status- Alert. General Appearance- Not in acute distress.   Skin General: Color- Normal Color. Moisture- Normal Moisture.  Neck Carotid Arteries- Normal color. Moisture- Normal Moisture. No carotid bruits. No JVD.  Chest and Lung Exam Auscultation: Breath Sounds:-Normal.  Cardiovascular Auscultation:Rythm- Regular. Murmurs & Other Heart Sounds:Auscultation of the heart reveals- No Murmurs.  Abdomen Inspection:-Inspeection Normal. Palpation/Percussion:Note:No mass. Palpation and Percussion of the abdomen reveal- Non Tender, Non Distended + BS, no rebound or guarding.   Neurologic Cranial Nerve exam:- CN III-XII intact(No nystagmus), symmetric smile. Strength:- 5/5 equal and symmetric strength both upper and lower extremities.   Lower ext- bilateral symmetrric calves. No swelling. Negative homans signs.    Assessment & Plan:   Patient Instructions  Htn well controlled.  Hyperlipidemia in diabetic pt. Statin treatment recommendation explained. Rx atorvastatin.  For dvt hx. Continue eliquis. Refer to hemmatologist.  Ckd. Continue to follow up with nephrologist.  Diabetes not well controlled recently. Adding invokona and continue amaryl. Check sugars twice daily morning and one post meal. Refer to diabetic educator and endocrinologist.  Flu vaccine today.  Follow up 1 month or sooner if needed.   Mackie Pai, PA-C

## 2021-10-31 NOTE — Addendum Note (Signed)
Addended by: Jeronimo Greaves on: 10/31/2021 10:26 AM   Modules accepted: Orders

## 2021-10-31 NOTE — Patient Instructions (Addendum)
Htn well controlled.  Hyperlipidemia in diabetic pt. Statin treatment recommendation explained. Rx atorvastatin.  For dvt hx. Continue eliquis. Refer to hematologist.  Ckd. Continue to follow up with nephrologist.  Diabetes not well controlled recently. Adding invokona and continue amaryl. Check sugars twice daily morning and one post meal. Refer to diabetic educator and endocrinologist.  Flu vaccine today.  Follow up 1 month or sooner if needed.

## 2021-11-01 NOTE — Telephone Encounter (Signed)
PA approved until 12/22/2020-12/21/2021

## 2021-11-12 ENCOUNTER — Other Ambulatory Visit: Payer: Self-pay | Admitting: Family

## 2021-11-12 ENCOUNTER — Inpatient Hospital Stay: Payer: Medicare HMO | Attending: Hematology & Oncology

## 2021-11-12 ENCOUNTER — Inpatient Hospital Stay: Payer: Medicare HMO | Admitting: Family

## 2021-11-12 ENCOUNTER — Telehealth: Payer: Self-pay | Admitting: Family

## 2021-11-12 ENCOUNTER — Other Ambulatory Visit: Payer: Self-pay

## 2021-11-12 ENCOUNTER — Encounter: Payer: Self-pay | Admitting: Family

## 2021-11-12 VITALS — BP 153/79 | HR 65 | Temp 97.9°F | Resp 17 | Wt 244.0 lb

## 2021-11-12 DIAGNOSIS — D6859 Other primary thrombophilia: Secondary | ICD-10-CM

## 2021-11-12 DIAGNOSIS — I82431 Acute embolism and thrombosis of right popliteal vein: Secondary | ICD-10-CM | POA: Diagnosis not present

## 2021-11-12 DIAGNOSIS — E119 Type 2 diabetes mellitus without complications: Secondary | ICD-10-CM | POA: Diagnosis not present

## 2021-11-12 DIAGNOSIS — K5900 Constipation, unspecified: Secondary | ICD-10-CM | POA: Insufficient documentation

## 2021-11-12 DIAGNOSIS — Z801 Family history of malignant neoplasm of trachea, bronchus and lung: Secondary | ICD-10-CM | POA: Insufficient documentation

## 2021-11-12 DIAGNOSIS — Z7984 Long term (current) use of oral hypoglycemic drugs: Secondary | ICD-10-CM | POA: Diagnosis not present

## 2021-11-12 DIAGNOSIS — Z87891 Personal history of nicotine dependence: Secondary | ICD-10-CM | POA: Insufficient documentation

## 2021-11-12 DIAGNOSIS — Z7901 Long term (current) use of anticoagulants: Secondary | ICD-10-CM | POA: Insufficient documentation

## 2021-11-12 LAB — CMP (CANCER CENTER ONLY)
ALT: 20 U/L (ref 0–44)
AST: 16 U/L (ref 15–41)
Albumin: 4.1 g/dL (ref 3.5–5.0)
Alkaline Phosphatase: 48 U/L (ref 38–126)
Anion gap: 7 (ref 5–15)
BUN: 32 mg/dL — ABNORMAL HIGH (ref 8–23)
CO2: 23 mmol/L (ref 22–32)
Calcium: 9.4 mg/dL (ref 8.9–10.3)
Chloride: 106 mmol/L (ref 98–111)
Creatinine: 1.97 mg/dL — ABNORMAL HIGH (ref 0.61–1.24)
GFR, Estimated: 36 mL/min — ABNORMAL LOW (ref >60)
Glucose, Bld: 184 mg/dL — ABNORMAL HIGH (ref 70–99)
Potassium: 4.4 mmol/L (ref 3.5–5.1)
Sodium: 136 mmol/L (ref 135–145)
Total Bilirubin: 0.3 mg/dL (ref 0.3–1.2)
Total Protein: 6.4 g/dL — ABNORMAL LOW (ref 6.5–8.1)

## 2021-11-12 LAB — CBC WITH DIFFERENTIAL (CANCER CENTER ONLY)
Abs Immature Granulocytes: 0.04 10*3/uL (ref 0.00–0.07)
Basophils Absolute: 0 10*3/uL (ref 0.0–0.1)
Basophils Relative: 1 %
Eosinophils Absolute: 0.4 10*3/uL (ref 0.0–0.5)
Eosinophils Relative: 6 %
HCT: 37.5 % — ABNORMAL LOW (ref 39.0–52.0)
Hemoglobin: 12.4 g/dL — ABNORMAL LOW (ref 13.0–17.0)
Immature Granulocytes: 1 %
Lymphocytes Relative: 25 %
Lymphs Abs: 1.6 10*3/uL (ref 0.7–4.0)
MCH: 30.3 pg (ref 26.0–34.0)
MCHC: 33.1 g/dL (ref 30.0–36.0)
MCV: 91.7 fL (ref 80.0–100.0)
Monocytes Absolute: 0.5 10*3/uL (ref 0.1–1.0)
Monocytes Relative: 7 %
Neutro Abs: 4.1 10*3/uL (ref 1.7–7.7)
Neutrophils Relative %: 60 %
Platelet Count: 191 10*3/uL (ref 150–400)
RBC: 4.09 MIL/uL — ABNORMAL LOW (ref 4.22–5.81)
RDW: 13 % (ref 11.5–15.5)
WBC Count: 6.6 10*3/uL (ref 4.0–10.5)
nRBC: 0 % (ref 0.0–0.2)

## 2021-11-12 LAB — ANTITHROMBIN III: AntiThromb III Func: 90 % (ref 75–120)

## 2021-11-12 LAB — D-DIMER, QUANTITATIVE: D-Dimer, Quant: 0.59 ug/mL-FEU — ABNORMAL HIGH (ref 0.00–0.50)

## 2021-11-12 LAB — LACTATE DEHYDROGENASE: LDH: 133 U/L (ref 98–192)

## 2021-11-12 NOTE — Progress Notes (Signed)
Hematology/Oncology Consultation   Name: Edenilson Austad      MRN: [id number]    Location: Room/bed info not found  Date: 11/12/2021 Time:9:25 AM   REFERRING PHYSICIAN:  Mackie Pai, PA-C  REASON FOR CONSULT:  Acute DVT of popliteal vein of right lower extremity    DIAGNOSIS: Acute DVT of popliteal vein of right lower extremity  HISTORY OF PRESENT ILLNESS: Mr. Eugene is a very pleasant 70 yo caucasian gentleman with diagnosis of acute DVT in the right popliteal vein in September 2022. This was his first thrombotic event.  He is doing well on Eliuqis PO BID and tolerating nicely. The cost is a bit high for him. I gave him the co-pay card set up info and he is going to try this.  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  He denies illness or injury prior to diagnosis.  No testosterone use. No history of stroke or liver disease.  No personal history of cancer. Mother had lung cancer (smoker).  He's had an appendectomy and 2 back surgeries in the remote past without any complications.  He is diabetic and is currently on Invokana and Amaryl.  No history of thyroid disease.  He states that his first (maternal) cousin just passed away from a pulmonary embolism. No other known family history of thrombotic event.  No fever, chills, n/v, cough, rash, SOB, chest pain, palpitations, abdominal pain or changes in bowel or bladder habits.  He has intermittent constipation and takes Metamucil which helps relieve his symptoms.  He has occasional lightheadedness.  No falls or syncope to report.  He feels that he has someone hold his feet and thinks this may be related to neuropathy.  He notes occasional tingling in his fingertips.  He has maintained a good appetite and is staying well hydrated.  He quit smoking over 25 years ago. He was using snuff packets up until a couple years ago. Rare ETOH socially.  He enjoys doing yard work for exercise.   ROS: All other  10 point review of systems is negative.   PAST MEDICAL HISTORY:   History reviewed. No pertinent past medical history.  ALLERGIES: Allergies  Allergen Reactions   Amoxicillin Itching      MEDICATIONS:  Current Outpatient Medications on File Prior to Visit  Medication Sig Dispense Refill   apixaban (ELIQUIS) 5 MG TABS tablet Take 1 tablet (5 mg total) by mouth 2 (two) times daily. 180 tablet 0   atorvastatin (LIPITOR) 10 MG tablet Take 1 tablet (10 mg total) by mouth daily. 30 tablet 3   Barberry-Oreg Grape-Goldenseal (BERBERINE COMPLEX PO) Take 1,500 mg by mouth in the morning and at bedtime.     benazepril (LOTENSIN) 10 MG tablet Take 1 tablet by mouth once daily 90 tablet 1   canagliflozin (INVOKANA) 100 MG TABS tablet Take 1 tablet (100 mg total) by mouth daily before breakfast. 30 tablet 3   CINNAMON PO Take by mouth 2 (two) times daily.     clindamycin (CLEOCIN) 300 MG capsule Take 1 capsule (300 mg total) by mouth 3 (three) times daily. 21 capsule 0   glimepiride (AMARYL) 1 MG tablet Take 1 tablet (1 mg total) by mouth daily with breakfast. 30 tablet 1   OVER THE COUNTER MEDICATION      predniSONE (DELTASONE) 5 MG tablet Take 6 pills for first day, 5 pills second day, 4 pills third day, 3 pills fourth day, 2 pills the  fifth day, and 1 pill sixth day. 21 tablet 0   tamsulosin (FLOMAX) 0.4 MG CAPS capsule Take 1 capsule (0.4 mg total) by mouth daily after supper. 30 capsule 6   No current facility-administered medications on file prior to visit.     PAST SURGICAL HISTORY History reviewed. No pertinent surgical history.  FAMILY HISTORY: History reviewed. No pertinent family history.  SOCIAL HISTORY:  has no history on file for tobacco use, alcohol use, and drug use.  PERFORMANCE STATUS: The patient's performance status is 1 - Symptomatic but completely ambulatory  PHYSICAL EXAM: Most Recent Vital Signs: Blood pressure (!) 153/79, pulse 65, temperature 97.9 F (36.6 C),  temperature source Oral, resp. rate 17, weight 244 lb (110.7 kg), SpO2 97 %. BP (!) 153/79 (BP Location: Left Arm, Patient Position: Sitting)   Pulse 65   Temp 97.9 F (36.6 C) (Oral)   Resp 17   Wt 244 lb (110.7 kg)   SpO2 97%   BMI 34.03 kg/m   General Appearance:    Alert, cooperative, no distress, appears stated age  Head:    Normocephalic, without obvious abnormality, atraumatic  Eyes:    PERRL, conjunctiva/corneas clear, EOM's intact, fundi    benign, both eyes             Throat:   Lips, mucosa, and tongue normal; teeth and gums normal  Neck:   Supple, symmetrical, trachea midline, no adenopathy;       thyroid:  No enlargement/tenderness/nodules; no carotid   bruit or JVD  Back:     Symmetric, no curvature, ROM normal, no CVA tenderness  Lungs:     Clear to auscultation bilaterally, respirations unlabored  Chest wall:    No tenderness or deformity  Heart:    Regular rate and rhythm, S1 and S2 normal, no murmur, rub   or gallop  Abdomen:     Soft, non-tender, bowel sounds active all four quadrants,    no masses, no organomegaly        Extremities:   Extremities normal, atraumatic, no cyanosis or edema  Pulses:   2+ and symmetric all extremities  Skin:   Skin color, texture, turgor normal, no rashes or lesions  Lymph nodes:   Cervical, supraclavicular, and axillary nodes normal  Neurologic:   CNII-XII intact. Normal strength, sensation and reflexes      throughout    LABORATORY DATA:  Results for orders placed or performed in visit on 11/12/21 (from the past 48 hour(s))  CBC with Differential ([place] Only)     Status: Abnormal   Collection Time: 11/12/21  8:41 AM  Result Value Ref Range   WBC Count 6.6 4.0 - 10.5 K/uL   RBC 4.09 (L) 4.22 - 5.81 MIL/uL   Hemoglobin 12.4 (L) 13.0 - 17.0 g/dL   HCT 37.5 (L) 39.0 - 52.0 %   MCV 91.7 80.0 - 100.0 fL   MCH 30.3 26.0 - 34.0 pg   MCHC 33.1 30.0 - 36.0 g/dL   RDW 13.0 11.5 - 15.5 %   Platelet Count 191 150 - 400  K/uL   nRBC 0.0 0.0 - 0.2 %   Neutrophils Relative % 60 %   Neutro Abs 4.1 1.7 - 7.7 K/uL   Lymphocytes Relative 25 %   Lymphs Abs 1.6 0.7 - 4.0 K/uL   Monocytes Relative 7 %   Monocytes Absolute 0.5 0.1 - 1.0 K/uL   Eosinophils Relative 6 %   Eosinophils Absolute 0.4 0.0 - 0.5  K/uL   Basophils Relative 1 %   Basophils Absolute 0.0 0.0 - 0.1 K/uL   Immature Granulocytes 1 %   Abs Immature Granulocytes 0.04 0.00 - 0.07 K/uL    Comment: Performed at [facility] at [facility], [address], [place], [place] [address]  CMP ([place] only)     Status: Abnormal   Collection Time: 11/12/21  8:41 AM  Result Value Ref Range   Sodium 136 135 - 145 mmol/L   Potassium 4.4 3.5 - 5.1 mmol/L   Chloride 106 98 - 111 mmol/L   CO2 23 22 - 32 mmol/L   Glucose, Bld 184 (H) 70 - 99 mg/dL    Comment: Glucose reference range applies only to samples taken after fasting for at least 8 hours.   BUN 32 (H) 8 - 23 mg/dL   Creatinine 1.97 (H) 0.61 - 1.24 mg/dL   Calcium 9.4 8.9 - 10.3 mg/dL   Total Protein 6.4 (L) 6.5 - 8.1 g/dL   Albumin 4.1 3.5 - 5.0 g/dL   AST 16 15 - 41 U/L   ALT 20 0 - 44 U/L   Alkaline Phosphatase 48 38 - 126 U/L   Total Bilirubin 0.3 0.3 - 1.2 mg/dL   GFR, Estimated 36 (L) >60 mL/min    Comment: (NOTE) Calculated using the CKD-EPI Creatinine Equation (2021)    Anion gap 7 5 - 15    Comment: Performed at [facility] at [facility], [address], [place], [place] [address]  D-dimer, quantitative     Status: Abnormal   Collection Time: 11/12/21  8:41 AM  Result Value Ref Range   D-Dimer, Quant 0.59 (H) 0.00 - 0.50 ug/mL-FEU    Comment: (NOTE) At the manufacturer cut-off value of 0.5 g/mL FEU, this assay has a negative predictive value of 95-100%.This assay is intended for use in conjunction with a clinical pretest probability (PTP) assessment model to exclude pulmonary embolism (PE) and deep venous  thrombosis (DVT) in outpatients suspected of PE or DVT. Results should be correlated with clinical presentation. Performed at [facility], [address] [place], [place] [address]       RADIOGRAPHY: No results found.     PATHOLOGY:   ASSESSMENT/PLAN:   All questions were answered. The patient knows to call the clinic with any problems, questions or concerns. We can certainly see the patient much sooner if necessary.  The patient was discussed with and also seen by Dr. Marin Olp and he is in agreement with the aforementioned.   Lottie Dawson

## 2021-11-12 NOTE — Telephone Encounter (Signed)
Scheduled appt per 11/22 los - called patient and left message for patient with appt date and time

## 2021-11-13 LAB — PROTEIN C, TOTAL: Protein C, Total: 67 % (ref 60–150)

## 2021-11-14 LAB — PROTEIN S, TOTAL: Protein S Ag, Total: 91 % (ref 60–150)

## 2021-11-14 LAB — DRVVT MIX: dRVVT Mix: 55.4 s — ABNORMAL HIGH (ref 0.0–40.4)

## 2021-11-14 LAB — DRVVT CONFIRM: dRVVT Confirm: 1.2 ratio (ref 0.8–1.2)

## 2021-11-14 LAB — PROTEIN S ACTIVITY: Protein S Activity: 98 % (ref 63–140)

## 2021-11-14 LAB — LUPUS ANTICOAGULANT PANEL
DRVVT: 70.9 s — ABNORMAL HIGH (ref 0.0–47.0)
PTT Lupus Anticoagulant: 37.6 s (ref 0.0–51.9)

## 2021-11-14 LAB — PROTEIN C ACTIVITY: Protein C Activity: 76 % (ref 73–180)

## 2021-11-15 LAB — CARDIOLIPIN ANTIBODIES, IGG, IGM, IGA
Anticardiolipin IgA: <9 APL U/mL (ref 0–11)
Anticardiolipin IgG: <9 GPL U/mL (ref 0–14)
Anticardiolipin IgM: 9 MPL U/mL (ref 0–12)

## 2021-11-15 LAB — BETA-2-GLYCOPROTEIN I ABS, IGG/M/A
Beta-2 Glyco I IgG: <9 GPI IgG units (ref 0–20)
Beta-2-Glycoprotein I IgA: <9 GPI IgA units (ref 0–25)
Beta-2-Glycoprotein I IgM: <9 GPI IgM units (ref 0–32)

## 2021-11-15 LAB — HOMOCYSTEINE: Homocysteine: 13.3 umol/L (ref 0.0–17.2)

## 2021-11-18 LAB — FACTOR 5 LEIDEN

## 2021-11-19 LAB — PROTHROMBIN GENE MUTATION

## 2021-11-20 ENCOUNTER — Telehealth: Payer: Self-pay | Admitting: Family

## 2021-11-20 NOTE — Telephone Encounter (Signed)
Left message with call back number to go over recent hyper coag results. No medication changes needed at this time. Will await call back.

## 2021-12-02 ENCOUNTER — Ambulatory Visit: Payer: Medicare HMO | Admitting: Family Medicine

## 2021-12-02 DIAGNOSIS — I82431 Acute embolism and thrombosis of right popliteal vein: Secondary | ICD-10-CM

## 2021-12-02 DIAGNOSIS — M7671 Peroneal tendinitis, right leg: Secondary | ICD-10-CM

## 2021-12-02 NOTE — Assessment & Plan Note (Signed)
No pain today and feels back to normal. -Counseled on home exercise therapy and supportive care.

## 2021-12-02 NOTE — Progress Notes (Signed)
  Craig Baldwin - 70 y.o. male MRN [id number]  Date of birth: 1951/05/22  SUBJECTIVE:  Including CC & ROS.  No chief complaint on file.   Craig Baldwin is a 70 y.o. male that is following up for his right leg pain and his blood clot.  He has been established with hematologist.  He denies any pain in his foot or ankle.   Review of Systems See HPI   HISTORY: Past Medical, Surgical, Social, and Family History Reviewed & Updated per EMR.   Pertinent Historical Findings include:  No past medical history on file.  No past surgical history on file.  No family history on file.  Social History   Socioeconomic History   Marital status: Married    Spouse name: Not on file   Number of children: Not on file   Years of education: Not on file   Highest education level: Not on file  Occupational History   Not on file  Tobacco Use   Smoking status: Not on file   Smokeless tobacco: Not on file  Substance and Sexual Activity   Alcohol use: Not on file   Drug use: Not on file   Sexual activity: Not on file  Other Topics Concern   Not on file  Social History Narrative   Not on file   Social Determinants of Health   Financial Resource Strain: Not on file  Food Insecurity: Not on file  Transportation Needs: Not on file  Physical Activity: Not on file  Stress: Not on file  Social Connections: Not on file  Intimate Partner Violence: Not on file     PHYSICAL EXAM:  VS: BP 128/70   Ht 5\' 11"  (1.803 m)   Wt 238 lb (108 kg)   BMI 33.19 kg/m  Physical Exam Gen: NAD, alert, cooperative with exam, well-appearing    ASSESSMENT & PLAN:   Peroneal tendinitis of lower leg, right No pain today and feels back to normal. -Counseled on home exercise therapy and supportive care.   Acute deep vein thrombosis (DVT) of popliteal vein of right lower extremity (HCC) Has been able to establish with hematology and outline a plan.

## 2021-12-02 NOTE — Assessment & Plan Note (Signed)
Has been able to establish with hematology and outline a plan.

## 2021-12-07 ENCOUNTER — Other Ambulatory Visit: Payer: Self-pay | Admitting: Family Medicine

## 2021-12-20 ENCOUNTER — Other Ambulatory Visit: Payer: Self-pay | Admitting: Family

## 2021-12-20 DIAGNOSIS — D6859 Other primary thrombophilia: Secondary | ICD-10-CM

## 2021-12-20 DIAGNOSIS — I82431 Acute embolism and thrombosis of right popliteal vein: Secondary | ICD-10-CM

## 2021-12-24 ENCOUNTER — Encounter: Payer: Self-pay | Admitting: Family

## 2021-12-24 ENCOUNTER — Inpatient Hospital Stay: Payer: Medicare HMO | Attending: Hematology & Oncology | Admitting: Family

## 2021-12-24 ENCOUNTER — Inpatient Hospital Stay: Payer: Medicare HMO

## 2021-12-24 ENCOUNTER — Ambulatory Visit (HOSPITAL_BASED_OUTPATIENT_CLINIC_OR_DEPARTMENT_OTHER)
Admission: RE | Admit: 2021-12-24 | Discharge: 2021-12-24 | Disposition: A | Discharge status: Home / Self Care | Payer: Medicare HMO | Source: Ambulatory Visit | Attending: Family | Admitting: Family

## 2021-12-24 ENCOUNTER — Other Ambulatory Visit: Payer: Self-pay

## 2021-12-24 ENCOUNTER — Telehealth: Payer: Self-pay | Admitting: *Deleted

## 2021-12-24 VITALS — BP 130/68 | HR 61 | Temp 98.4°F | Resp 18 | Ht 71.0 in | Wt 240.1 lb

## 2021-12-24 DIAGNOSIS — I82431 Acute embolism and thrombosis of right popliteal vein: Secondary | ICD-10-CM

## 2021-12-24 DIAGNOSIS — Z801 Family history of malignant neoplasm of trachea, bronchus and lung: Secondary | ICD-10-CM | POA: Diagnosis not present

## 2021-12-24 DIAGNOSIS — D6851 Activated protein C resistance: Secondary | ICD-10-CM

## 2021-12-24 DIAGNOSIS — Z87891 Personal history of nicotine dependence: Secondary | ICD-10-CM | POA: Insufficient documentation

## 2021-12-24 DIAGNOSIS — Z7984 Long term (current) use of oral hypoglycemic drugs: Secondary | ICD-10-CM | POA: Diagnosis not present

## 2021-12-24 DIAGNOSIS — Z7901 Long term (current) use of anticoagulants: Secondary | ICD-10-CM | POA: Insufficient documentation

## 2021-12-24 DIAGNOSIS — E119 Type 2 diabetes mellitus without complications: Secondary | ICD-10-CM | POA: Insufficient documentation

## 2021-12-24 DIAGNOSIS — D6859 Other primary thrombophilia: Secondary | ICD-10-CM

## 2021-12-24 LAB — LACTATE DEHYDROGENASE: LDH: 142 U/L (ref 98–192)

## 2021-12-24 LAB — CBC WITH DIFFERENTIAL (CANCER CENTER ONLY)
Abs Immature Granulocytes: 0.04 10*3/uL (ref 0.00–0.07)
Basophils Absolute: 0 10*3/uL (ref 0.0–0.1)
Basophils Relative: 1 %
Eosinophils Absolute: 0.4 10*3/uL (ref 0.0–0.5)
Eosinophils Relative: 4 %
HCT: 39.8 % (ref 39.0–52.0)
Hemoglobin: 13.3 g/dL (ref 13.0–17.0)
Immature Granulocytes: 1 %
Lymphocytes Relative: 29 %
Lymphs Abs: 2.3 10*3/uL (ref 0.7–4.0)
MCH: 30.4 pg (ref 26.0–34.0)
MCHC: 33.4 g/dL (ref 30.0–36.0)
MCV: 90.9 fL (ref 80.0–100.0)
Monocytes Absolute: 0.6 10*3/uL (ref 0.1–1.0)
Monocytes Relative: 8 %
Neutro Abs: 4.8 10*3/uL (ref 1.7–7.7)
Neutrophils Relative %: 57 %
Platelet Count: 227 10*3/uL (ref 150–400)
RBC: 4.38 MIL/uL (ref 4.22–5.81)
RDW: 12.8 % (ref 11.5–15.5)
WBC Count: 8.2 10*3/uL (ref 4.0–10.5)
nRBC: 0 % (ref 0.0–0.2)

## 2021-12-24 LAB — CMP (CANCER CENTER ONLY)
ALT: 24 U/L (ref 0–44)
AST: 18 U/L (ref 15–41)
Albumin: 4.4 g/dL (ref 3.5–5.0)
Alkaline Phosphatase: 54 U/L (ref 38–126)
Anion gap: 9 (ref 5–15)
BUN: 38 mg/dL — ABNORMAL HIGH (ref 8–23)
CO2: 23 mmol/L (ref 22–32)
Calcium: 9.6 mg/dL (ref 8.9–10.3)
Chloride: 105 mmol/L (ref 98–111)
Creatinine: 2.26 mg/dL — ABNORMAL HIGH (ref 0.61–1.24)
GFR, Estimated: 30 mL/min — ABNORMAL LOW (ref >60)
Glucose, Bld: 106 mg/dL — ABNORMAL HIGH (ref 70–99)
Potassium: 4.9 mmol/L (ref 3.5–5.1)
Sodium: 137 mmol/L (ref 135–145)
Total Bilirubin: 0.5 mg/dL (ref 0.3–1.2)
Total Protein: 6.9 g/dL (ref 6.5–8.1)

## 2021-12-24 LAB — D-DIMER, QUANTITATIVE: D-Dimer, Quant: 0.52 ug/mL-FEU — ABNORMAL HIGH (ref 0.00–0.50)

## 2021-12-24 NOTE — Progress Notes (Signed)
Hematology and Oncology Follow Up Visit  Craig Baldwin [id number] 12-31-1950 71 y.o. 12/24/2021   Principle Diagnosis:  Acute DVT of popliteal vein of right lower extremity Heterozygous for Factor V Leiden   Current Therapy:   Eliquis 5 mg PO BID   Interim History:  Craig Baldwin is here today for follow-up. He had his repeat US of the right leg showed residual, partially occluding DVT within the right popliteal vein. He has been taking his Eliquis BID as prescribed.  No bleeding, bruising or petechiae.  He has an appointment with dermatology for evaluation of a moles on the right temple as well as the back of his neck.  He also has what he thinks is an abscess post root canal and plans to contact his dentist today to discuss treatment.  No fever, chills, n/v, cough, rash, dizziness, SOB, chest pain, palpitations, abdominal pain or changes in bowel or bladder habits.  No swelling, tenderness, numbness or tingling in his extremities.  No falls or syncope to report.  He has maintained a good appetite and is staying well hydrated throughout the day. His weight is stable at 240 lbs.   ECOG Performance Status: 1 - Symptomatic but completely ambulatory  Medications:  Allergies as of 12/24/2021       Reactions   Amoxicillin Itching        Medication List        Accurate as of December 24, 2021  1:36 PM. If you have any questions, ask your nurse or doctor.          STOP taking these medications    clindamycin 300 MG capsule Commonly known as: CLEOCIN Stopped by: Lottie Dawson, NP   predniSONE 5 MG tablet Commonly known as: DELTASONE Stopped by: Lottie Dawson, NP       TAKE these medications    apixaban 5 MG Tabs tablet Commonly known as: ELIQUIS Take 1 tablet (5 mg total) by mouth 2 (two) times daily.   atorvastatin 10 MG tablet Commonly known as: LIPITOR Take 1 tablet (10 mg total) by mouth daily.   benazepril 10 MG tablet Commonly known as: LOTENSIN Take 1  tablet by mouth once daily   BERBERINE COMPLEX PO Take 1,500 mg by mouth in the morning and at bedtime.   Barberry-Oreg Grape-Goldenseal [phone] MG Caps Take by mouth.   canagliflozin 100 MG Tabs tablet Commonly known as: INVOKANA Take 1 tablet (100 mg total) by mouth daily before breakfast.   CINNAMON PO Take by mouth 2 (two) times daily.   glimepiride 1 MG tablet Commonly known as: AMARYL Take 1 tablet by mouth once daily with breakfast   OVER THE COUNTER MEDICATION   tamsulosin 0.4 MG Caps capsule Commonly known as: FLOMAX Take 1 capsule (0.4 mg total) by mouth daily after supper.        Allergies:  Allergies  Allergen Reactions   Amoxicillin Itching    Past Medical History, Surgical history, Social history, and Family History were reviewed and updated.  Review of Systems: All other 10 point review of systems is negative.   Physical Exam:  height is 5\' 11"  (1.803 m) and weight is 240 lb 1.9 oz (108.9 kg). His oral temperature is 98.4 F (36.9 C). His blood pressure is 130/68 and his pulse is 61. His respiration is 18 and oxygen saturation is 97%.   Wt Readings from Last 3 Encounters:  12/24/21 240 lb 1.9 oz (108.9 kg)  12/02/21 238 lb (108 kg)  11/12/21 244  lb (110.7 kg)    Ocular: Sclerae unicteric, pupils equal, round and reactive to light Ear-nose-throat: Oropharynx clear, dentition fair Lymphatic: No cervical or supraclavicular adenopathy Lungs no rales or rhonchi, good excursion bilaterally Heart regular rate and rhythm, no murmur appreciated Abd soft, nontender, positive bowel sounds MSK no focal spinal tenderness, no joint edema Neuro: non-focal, well-oriented, appropriate affect Breasts: Deferred   Lab Results  Component Value Date   WBC 8.2 12/24/2021   HGB 13.3 12/24/2021   HCT 39.8 12/24/2021   MCV 90.9 12/24/2021   PLT 227 12/24/2021   No results found for: FERRITIN, IRON, TIBC, UIBC, IRONPCTSAT Lab Results  Component Value Date    RBC 4.38 12/24/2021   No results found for: KPAFRELGTCHN, LAMBDASER, KAPLAMBRATIO No results found for: IGGSERUM, IGA, IGMSERUM No results found for: Odetta Pink, SPEI   Chemistry      Component Value Date/Time   NA 136 11/12/2021 0841   NA 135 10/08/2021 0852   K 4.4 11/12/2021 0841   CL 106 11/12/2021 0841   CO2 23 11/12/2021 0841   BUN 32 (H) 11/12/2021 0841   BUN 23 10/08/2021 0852   CREATININE 1.97 (H) 11/12/2021 0841   CREATININE 2.39 (H) 06/01/2020 1544      Component Value Date/Time   CALCIUM 9.4 11/12/2021 0841   ALKPHOS 48 11/12/2021 0841   AST 16 11/12/2021 0841   ALT 20 11/12/2021 0841   BILITOT 0.3 11/12/2021 0841       Impression and Plan: Craig Baldwin is a very pleasant 71 yo caucasian gentleman with diagnosis of acute DVT in the right popliteal vein in September 2022. 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 He will continue his same regimen with Eliquis.  Follow-up and repeat US in 4 months.   Lottie Dawson, NP 1/3/20231:36 PM

## 2021-12-24 NOTE — Telephone Encounter (Signed)
Per 12/24/20 los - gave upcoming appointments - confirmed °

## 2022-01-05 ENCOUNTER — Other Ambulatory Visit: Payer: Self-pay | Admitting: Family Medicine

## 2022-01-05 DIAGNOSIS — I82431 Acute embolism and thrombosis of right popliteal vein: Secondary | ICD-10-CM

## 2022-01-06 ENCOUNTER — Other Ambulatory Visit: Payer: Self-pay

## 2022-01-06 ENCOUNTER — Other Ambulatory Visit: Payer: Self-pay | Admitting: Family Medicine

## 2022-01-06 DIAGNOSIS — I82431 Acute embolism and thrombosis of right popliteal vein: Secondary | ICD-10-CM

## 2022-01-06 MED ORDER — APIXABAN 5 MG PO TABS: 5.0000 mg | ORAL_TABLET | Freq: Two times a day (BID) | ORAL | 0 refills | Status: DC

## 2022-01-06 NOTE — Telephone Encounter (Signed)
Pt called requesting refill on eliquis, that Dr. Raeford Razor was prescribing it but he is now released from him care and needs it filled. Checked with Gillian Shields who verbalized order for eliquis. Order sent and pt aware.

## 2022-01-10 ENCOUNTER — Ambulatory Visit: Payer: Medicare HMO | Admitting: Dietician

## 2022-01-21 ENCOUNTER — Encounter: Payer: Self-pay | Admitting: Dietician

## 2022-01-21 ENCOUNTER — Other Ambulatory Visit: Payer: Self-pay

## 2022-01-21 ENCOUNTER — Telehealth: Payer: Self-pay | Admitting: Medical

## 2022-01-21 ENCOUNTER — Encounter: Payer: Medicare HMO | Attending: Medical | Admitting: Dietician

## 2022-01-21 DIAGNOSIS — E785 Hyperlipidemia, unspecified: Secondary | ICD-10-CM | POA: Insufficient documentation

## 2022-01-21 DIAGNOSIS — E1169 Type 2 diabetes mellitus with other specified complication: Secondary | ICD-10-CM | POA: Insufficient documentation

## 2022-01-21 DIAGNOSIS — D485 Neoplasm of uncertain behavior of skin: Secondary | ICD-10-CM | POA: Diagnosis not present

## 2022-01-21 DIAGNOSIS — Z713 Dietary counseling and surveillance: Secondary | ICD-10-CM | POA: Diagnosis not present

## 2022-01-21 DIAGNOSIS — L300 Nummular dermatitis: Secondary | ICD-10-CM | POA: Diagnosis not present

## 2022-01-21 DIAGNOSIS — N189 Chronic kidney disease, unspecified: Secondary | ICD-10-CM | POA: Insufficient documentation

## 2022-01-21 DIAGNOSIS — E1122 Type 2 diabetes mellitus with diabetic chronic kidney disease: Secondary | ICD-10-CM

## 2022-01-21 DIAGNOSIS — N1832 Chronic kidney disease, stage 3b: Secondary | ICD-10-CM

## 2022-01-21 NOTE — Patient Instructions (Addendum)
-  Eye appointment -Consider asking your doctor for a prescription for a [place]  (you can use your phone as a reader). - Consider Vitamin D3 1000 units daily (25mg )  - Rethink what you drink - Avoid all beverages with sugar or carbohydrate - Consistent meals (avoid skipping as this can cause low blood sugar)     - Less meat (deck of cards portion size) - Balance carbohydrates throughout the day  - Add more fresh fruit and vegetables - Avoid added salt, processed meat, salt substitute that contains potassium - Eat more foods prepared at home rather than out - Subway 6" sub can be acceptable or choose a salad with fresh fruit

## 2022-01-21 NOTE — Progress Notes (Signed)
Diabetes Self-Management Education  Visit Type: First/Initial  Appt. Start Time: 0945 Appt. End Time: 1130  01/21/2022  [name], identified by name and date of birth, is a 71 y.o. male with a diagnosis of Diabetes: Type 2.   ASSESSMENT Patient is here today alone.    History includes Type 2 diabetes, HLD, DVT's, cyst in mouth that needs surgery, CKD Medications include:  Invokana, glimepiride, atorvastatin, Berberine, Cinnamon He is followed by [facility] for his Kidneys He is not checking his blood glucose  Quit chewing tobacco Labs:  A1C 10.4% 10/08/2021 increased from 7.2% 12/03/2020 Cholesterol 159, HDL 28, LDL 95, Triglycerides 206 10/08/2021, Vitamin D 34 04/22/2021, eGFR 30, BUN 38, Creatinine 2.26, Potassium 4.9 12/24/2021, proteinuria 10/29/2021 and prior visits  Weight hx: 71" 245 lbs 01/21/2022 Lost to 199 lbs on Keto diet 2021 but regained. 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  "Eat real food."- Meat, eggs, vegetables and was not drinking milk, occasional cheese, yogurt daily and no fruit or starches.  (Low carb high fat diet) Motivation decreased when this man no longer was on the radio and went back to his usual way of eating.  Gained weight when he stopped the nicotine patches as well.  Patient lives with his wife.  He does the shopping and she does much of the cooking.  His grandchildren are now living with them and his wife does not want to cook 2 separate meals. He is a Administrator and drives local trips. Stops every 1.5 hours and walks around. Stopped regular soda except for 1 every 2-3 weeks.  Height _0  (1.803 m), weight 245 lb (111.1 kg). Body mass index is 34.17 kg/m.   Diabetes Self-Management Education - 01/21/22 1015       Visit Information   Visit Type First/Initial      Initial Visit   Diabetes Type Type 2    Are you currently following a meal plan? No    Are you taking your  medications as prescribed? Yes    Date Diagnosed --   cannot remember     Health Coping   How would you rate your overall health? Good      Psychosocial Assessment   Patient Belief/Attitude about Diabetes Motivated to manage diabetes    Self-care barriers None    Self-management support Doctor's office;Family    Other persons present Patient    Patient Concerns Nutrition/Meal planning;Glycemic Control;Weight Control    Special Needs None    Preferred Learning Style No preference indicated    Learning Readiness Ready    How often do you need to have someone help you when you read instructions, pamphlets, or other written materials from your doctor or pharmacy? 1 - Never    What is the last grade level you completed in school? 9      Pre-Education Assessment   Patient understands the diabetes disease and treatment process. Needs Review    Patient understands incorporating nutritional management into lifestyle. Needs Review    Patient undertands incorporating physical activity into lifestyle. Needs Review    Patient understands using medications safely. Needs Review    Patient understands monitoring blood glucose, interpreting and using results Needs Review    Patient understands prevention, detection, and treatment of acute complications. Needs Review    Patient understands prevention, detection, and treatment of chronic complications. Needs Review    Patient understands how to develop strategies to  address psychosocial issues. Needs Review    Patient understands how to develop strategies to promote health/change behavior. Needs Review      Complications   Last HgB A1C per patient/outside source 10.4 %   10/08/2021 increased from 7.2% 12/03/2020   How often do you check your blood sugar? 0 times/day (not testing)    Have you had a dilated eye exam in the past 12 months? No    Have you had a dental exam in the past 12 months? Yes    Are you checking your feet? Yes    How many days per  week are you checking your feet? 7      Dietary Intake   Breakfast Breakfast pizza OR egg, bacon or sausage sandwich on bun OR eggs and bacon only when losing weight    Snack (morning) activia yogurt, Metamucil wafer    Lunch McDonald's sausage and egg Mcmuffin and/or sausage burrito    Snack (afternoon) NABS and occasional slice of pound cake    Dinner spaghetti with meat sauce, garlic toast, occasional salad OR 1 hamburgers or 2 hotdogs AND cheese fries or chips OR baked pork chops or steak with sweet potato, vegetables    Snack (evening) metamucil wafer, occasional ice cream (1 cup)    Beverage(s) half and half tea, black coffee, water, sparkling water      Exercise   Exercise Type Light (walking / raking leaves)   related to work only     Patient Education   Previous Diabetes Education Yes (please comment)   20 years ago   Disease state  Definition of diabetes, type 1 and 2, and the diagnosis of diabetes    Nutrition management  Role of diet in the treatment of diabetes and the relationship between the three main macronutrients and blood glucose level;Meal options for control of blood glucose level and chronic complications.;Other (comment);Information on hints to eating out and maintain blood glucose control.   diet for CKD, low sodium, low protein, unprocessed foods   Physical activity and exercise  Role of exercise on diabetes management, blood pressure control and cardiac health.    Medications Reviewed patients medication for diabetes, action, purpose, timing of dose and side effects.    Monitoring Identified appropriate SMBG and/or A1C goals.;Daily foot exams;Yearly dilated eye exam    Acute complications Taught treatment of hypoglycemia - the 15 rule.;Discussed and identified patients' treatment of hyperglycemia.    Chronic complications Relationship between chronic complications and blood glucose control    Psychosocial adjustment Worked with patient to identify barriers to care  and solutions;Role of stress on diabetes;Identified and addressed patients feelings and concerns about diabetes      Individualized Goals (developed by patient)   Nutrition General guidelines for healthy choices and portions discussed;Other (comment)   renal diet   Physical Activity Exercise 5-7 days per week;30 minutes per day    Medications take my medication as prescribed    Monitoring  test my blood glucose as discussed    Reducing Risk examine blood glucose patterns;increase portions of healthy fats      Post-Education Assessment   Patient understands the diabetes disease and treatment process. Demonstrates understanding / competency    Patient understands incorporating nutritional management into lifestyle. Needs Review    Patient undertands incorporating physical activity into lifestyle. Demonstrates understanding / competency    Patient understands using medications safely. Demonstrates understanding / competency    Patient understands monitoring blood glucose, interpreting and using results Demonstrates  understanding / competency    Patient understands prevention, detection, and treatment of acute complications. Demonstrates understanding / competency    Patient understands prevention, detection, and treatment of chronic complications. Demonstrates understanding / competency    Patient understands how to develop strategies to address psychosocial issues. Demonstrates understanding / competency    Patient understands how to develop strategies to promote health/change behavior. Needs Review      Outcomes   Expected Outcomes Demonstrated interest in learning. Expect positive outcomes    Future DMSE PRN    Program Status Completed             Individualized Plan for Diabetes Self-Management Training:   Learning Objective:  Patient will have a greater understanding of diabetes self-management. Patient education plan is to attend individual and/or group sessions per assessed  needs and concerns.   Plan:   Patient Instructions  -Eye appointment -Consider asking your doctor for a prescription for a [place]  (you can use your phone as a reader). - Consider Vitamin D3 1000 units daily (53m)  - Rethink what you drink - Avoid all beverages with sugar or carbohydrate - Consistent meals (avoid skipping as this can cause low blood sugar)     - Less meat (deck of cards portion size) - Balance carbohydrates throughout the day  - Add more fresh fruit and vegetables - Avoid added salt, processed meat, salt substitute that contains potassium - Eat more foods prepared at home rather than out - Subway 6" sub can be acceptable or choose a salad with fresh fruit    Expected Outcomes:  Demonstrated interest in learning. Expect positive outcomes  Education material provided: ADA - How to Thrive: A Guide for Your Journey with Diabetes; National Kidney Diet Placemat  If problems or questions, patient to contact team via:  Phone  Future DSME appointment: PRN

## 2022-01-21 NOTE — Telephone Encounter (Signed)
Patient states he saw a nutritionist today and she recommended him to speak to Percell Miller about getting a Libre 2 machine to check his sugars. He would like a call back to know how to go about it. Please advise.

## 2022-01-24 ENCOUNTER — Telehealth: Payer: Self-pay

## 2022-01-24 NOTE — Telephone Encounter (Signed)
Pt called and notified and gave him the number to endo to call to make an appointment

## 2022-01-24 NOTE — Telephone Encounter (Signed)
Spoke with patient to get him scheduled for a appointment with Dr. Kelton Pillar and he has declined at this time and will give a callback when he is ready.

## 2022-02-04 ENCOUNTER — Encounter: Payer: Self-pay | Admitting: Internal Medicine

## 2022-02-04 ENCOUNTER — Ambulatory Visit: Payer: Medicare HMO | Admitting: Internal Medicine

## 2022-02-04 VITALS — BP 136/82 | HR 67 | Ht 71.0 in | Wt 239.0 lb

## 2022-02-04 DIAGNOSIS — E1122 Type 2 diabetes mellitus with diabetic chronic kidney disease: Secondary | ICD-10-CM | POA: Diagnosis not present

## 2022-02-04 DIAGNOSIS — E785 Hyperlipidemia, unspecified: Secondary | ICD-10-CM | POA: Diagnosis not present

## 2022-02-04 DIAGNOSIS — N1832 Chronic kidney disease, stage 3b: Secondary | ICD-10-CM | POA: Insufficient documentation

## 2022-02-04 DIAGNOSIS — E1165 Type 2 diabetes mellitus with hyperglycemia: Secondary | ICD-10-CM

## 2022-02-04 DIAGNOSIS — E1142 Type 2 diabetes mellitus with diabetic polyneuropathy: Secondary | ICD-10-CM

## 2022-02-04 DIAGNOSIS — E1169 Type 2 diabetes mellitus with other specified complication: Secondary | ICD-10-CM

## 2022-02-04 LAB — POCT GLYCOSYLATED HEMOGLOBIN (HGB A1C): Hemoglobin A1C: 6.9 % — AB (ref 4.0–5.6)

## 2022-02-04 LAB — POCT GLUCOSE (DEVICE FOR HOME USE): Glucose Fasting, POC: 158 mg/dL — AB (ref 70–99)

## 2022-02-04 MED ORDER — GLIMEPIRIDE 1 MG PO TABS: 1.0000 mg | ORAL_TABLET | Freq: Every day | ORAL | 3 refills | Status: DC

## 2022-02-04 MED ORDER — EMPAGLIFLOZIN 10 MG PO TABS: 10.0000 mg | ORAL_TABLET | Freq: Every day | ORAL | 3 refills | Status: DC

## 2022-02-04 NOTE — Progress Notes (Signed)
Name: Craig Baldwin  MRN/ DOB: [id number], 1951-11-27   Age/ Sex: 71 y.o., male    PCP: Elise Benne   Reason for Endocrinology Evaluation: Type 2 Diabetes Mellitus     Date of Initial Endocrinology Visit: 02/04/2022     PATIENT IDENTIFIER: Craig Baldwin is a 71 y.o. male with a past medical history of T2DM, CKD, dyslipidemia, obesity and OSA, hx of DVT. The patient presented for initial endocrinology clinic visit on 02/04/2022 for consultative assistance with his diabetes management.    HPI: Craig Baldwin was    Diagnosed with DM years ago  Prior Medications tried/Intolerance: Glimepiride started 12/2021. Actos, Metformin.  Currently checking blood sugars 0 x / day. Does not believe the validity of glucose meter  Hemoglobin A1c has ranged from 6.4% in 2021, peaking at 10.4 %in 2022. Patient required assistance for hypoglycemia: no Patient has required hospitalization within the last 1 year from hyper or hypoglycemia: no just for DVT   In terms of diet, the patient eats 2 meals He started eating nabs 2-3 a day.    Describes stiffness of the feet as well as occasional tingling and numbness,  Denies nausea, vomiting or diarrhea     HOME DIABETES REGIMEN: Invokana 100 mg daily  Glimepiride 1 mg daily     Statin: yes ACE-I/ARB: yes Prior Diabetic Education: Yes, saw Mickel Baas 12/2021   METER DOWNLOAD SUMMARY: did not bring    DIABETIC COMPLICATIONS: Microvascular complications:  CKD, neuropathy Denies: retinopathy Last eye exam: Completed does not recall   Macrovascular complications:   Denies: CAD, PVD, CVA   PAST HISTORY: Past Medical History:  Past Medical History:  Diagnosis Date   Chronic kidney disease    Diabetes mellitus without complication ([place])    Hyperlipidemia    Hypertension    Past Surgical History: No past surgical history on file.  Social History:  reports that he quit smoking about 25 years ago. His smoking use included  cigarettes. He quit smokeless tobacco use about 4 years ago.  His smokeless tobacco use included chew. No history on file for alcohol use and drug use. Family History: No family history on file.   HOME MEDICATIONS: Allergies as of 02/04/2022       Reactions   Amoxicillin Itching        Medication List        Accurate as of February 04, 2022  8:50 AM. If you have any questions, ask your nurse or doctor.          apixaban 5 MG Tabs tablet Commonly known as: ELIQUIS Take 1 tablet (5 mg total) by mouth 2 (two) times daily.   atorvastatin 10 MG tablet Commonly known as: LIPITOR Take 1 tablet (10 mg total) by mouth daily.   benazepril 10 MG tablet Commonly known as: LOTENSIN Take 1 tablet by mouth once daily   BERBERINE COMPLEX PO Take 1,500 mg by mouth in the morning and at bedtime. What changed: Another medication with the same name was removed. Continue taking this medication, and follow the directions you see here. Changed by: Dorita Sciara, MD   canagliflozin 100 MG Tabs tablet Commonly known as: INVOKANA Take 1 tablet (100 mg total) by mouth daily before breakfast.   cholecalciferol 25 MCG (1000 UNIT) tablet Commonly known as: VITAMIN D3 Take 1,000 Units by mouth daily.   CINNAMON PO Take by mouth 2 (two) times daily.   glimepiride 1 MG tablet Commonly known as: AMARYL Take 1 tablet  by mouth once daily with breakfast   OVER THE COUNTER MEDICATION   tamsulosin 0.4 MG Caps capsule Commonly known as: FLOMAX Take 1 capsule (0.4 mg total) by mouth daily after supper.         ALLERGIES: Allergies  Allergen Reactions   Amoxicillin Itching     REVIEW OF SYSTEMS: A comprehensive ROS was conducted with the patient and is negative except as per HPI    OBJECTIVE:   VITAL SIGNS: BP 136/82 (BP Location: Left Arm, Patient Position: Sitting, Cuff Size: Small)    Pulse 67    Ht 5\' 11"  (1.803 m)    Wt 239 lb (108.4 kg)    SpO2 96%    BMI 33.33 kg/m     PHYSICAL EXAM:  General: Pt appears well and is in NAD  Neck: General: Supple without adenopathy or carotid bruits. Thyroid: Thyroid size normal.  No goiter or nodules appreciated.  Lungs: Clear with good BS bilat with no rales, rhonchi, or wheezes  Heart: RRR with normal S1 and S2 and no gallops; no murmurs; no rub  Abdomen: Normoactive bowel sounds, soft, nontender, without masses or organomegaly palpable  Extremities:  Lower extremities - No pretibial edema. No lesions.  Neuro: MS is good with appropriate affect, pt is alert and Ox3    DM foot exam: 02/04/2022  The skin of the feet is intact without sores or ulcerations. The pedal pulses are 2+ on right and 2+ on left. The sensation is decreased to a screening 5.07, 10 gram monofilament bilaterally at the toes    DATA REVIEWED:  Lab Results  Component Value Date   HGBA1C 6.9 (A) 02/04/2022   HGBA1C 10.4 (H) 10/08/2021   HGBA1C 7.2 (H) 12/03/2020   Lab Results  Component Value Date   MICROALBUR 5.6 12/03/2020   LDLCALC 95 10/08/2021   CREATININE 2.26 (H) 12/24/2021   No results found for: [facility]  Lab Results  Component Value Date   CHOL 159 10/08/2021   HDL 28 (L) 10/08/2021   LDLCALC 95 10/08/2021   TRIG 206 (H) 10/08/2021   CHOLHDL 5.7 (H) 10/08/2021        Latest Reference Range & Units 12/24/21 13:04  Sodium 135 - 145 mmol/L 137  Potassium 3.5 - 5.1 mmol/L 4.9  Chloride 98 - 111 mmol/L 105  CO2 22 - 32 mmol/L 23  Glucose 70 - 99 mg/dL 106 (H)  BUN 8 - 23 mg/dL 38 (H)  Creatinine 0.61 - 1.24 mg/dL 2.26 (H)  Calcium 8.9 - 10.3 mg/dL 9.6  Anion gap 5 - 15  9  Alkaline Phosphatase 38 - 126 U/L 54  Albumin 3.5 - 5.0 g/dL 4.4  AST 15 - 41 U/L 18  ALT 0 - 44 U/L 24  Total Protein 6.5 - 8.1 g/dL 6.9  Total Bilirubin 0.3 - 1.2 mg/dL 0.5  GFR, Est Non African American >60 mL/min 30 (L)   ASSESSMENT / PLAN / RECOMMENDATIONS:   1) Type 2 Diabetes Mellitus, Optimally controlled, With CKD III and  neuropathic  complications - Most recent A1c of 6.9 %. Goal A1c < 7.0 %.    - His A1c has trended down from 10.4 %  - He does not check his glucose because he does not trust the accuracy of glucose meters, he is asking for a CGM but I explained to his this will be a cash pay for him as medicare requires insulin for coverage which he is not on. We also discussed that  CGM's are not as accurate as finger sticks. Would like to hold off  - He was not able to confirm his intake of Invakana ? When I went to fill it, a note stated jardiance is preferred . We discussed cardiovascular and renal benefits of SGLT-2 inhibitors  - He is somewhat a poor historian  - Will refill Glimepiride   MEDICATIONS:  Will switch INvokana to Jardiance 10 mg, 1 tablet  daily  Continue Glimepiride 1 mg, 1 tablet before Breakfast   EDUCATION / INSTRUCTIONS: BG monitoring instructions: Patient does't trust the validity of glucose meter  Call [place] [facility] if: BG persistently < 70 I reviewed the Rule of 15 for the treatment of hypoglycemia in detail with the patient. Literature supplied.   2) Diabetic complications:  Eye: Does not have known diabetic retinopathy.  Neuro/ Feet: Does  have known diabetic peripheral neuropathy. Renal: Patient does  have known baseline CKD. Follows with Dr. Danelle Berry with [facility]     F/U in 4 months    Signed electronically by: Mack Guise, MD  [facility]  [facility] [address] [place], [place] [address] Phone: [phone] FAX: [phone]   CC: Elise Benne [address] [place] [address] Phone: [phone]  Fax: [phone]    Return to Endocrinology clinic as below: Future Appointments  Date Time Provider [place]  04/25/2022  8:15 AM [facility] None  04/25/2022  8:30 AM Celso Amy, NP [facility] None

## 2022-02-04 NOTE — Patient Instructions (Addendum)
-   Will switch INvokana to Jardiance 10 mg, 1 tablet  daily  - Continue Glimepiride 1 mg, 1 tablet before Breakfast      HOW TO TREAT LOW BLOOD SUGARS (Blood sugar LESS THAN 70 MG/DL) Please follow the RULE OF 15 for the treatment of hypoglycemia treatment (when your (blood sugars are less than 70 mg/dL)   STEP 1: Take 15 grams of carbohydrates when your blood sugar is low, which includes:  3-4 GLUCOSE TABS  OR 3-4 OZ OF JUICE OR REGULAR SODA OR ONE TUBE OF GLUCOSE GEL    STEP 2: RECHECK blood sugar in 15 MINUTES STEP 3: If your blood sugar is still low at the 15 minute recheck --> then, go back to STEP 1 and treat AGAIN with another 15 grams of carbohydrates.

## 2022-02-11 ENCOUNTER — Telehealth: Payer: Self-pay | Admitting: Medical

## 2022-02-11 NOTE — Telephone Encounter (Signed)
Pt stated his dentist office has sent over information regarding his upcoming gum surgery. He did not if they faxed it or what number they sent it to. Asked pt if this was for surgical clearance and if he wanted to set up appt,he stated he did not know. Informed pt of our fax number so he could confirm dentist office that they faxed to the right location, and advised pt Craig Baldwin would be made aware of incoming fax. Please advise.

## 2022-02-11 NOTE — Telephone Encounter (Signed)
Pt called and scheduled for 02/17/22 for surgical clearance

## 2022-02-17 ENCOUNTER — Ambulatory Visit (INDEPENDENT_AMBULATORY_CARE_PROVIDER_SITE_OTHER): Payer: Medicare HMO | Admitting: Medical

## 2022-02-17 ENCOUNTER — Telehealth: Payer: Self-pay | Admitting: Medical

## 2022-02-17 VITALS — BP 126/69 | HR 66 | Temp 98.2°F | Resp 18 | Ht 71.0 in | Wt 237.6 lb

## 2022-02-17 DIAGNOSIS — R0789 Other chest pain: Secondary | ICD-10-CM

## 2022-02-17 DIAGNOSIS — N1831 Chronic kidney disease, stage 3a: Secondary | ICD-10-CM

## 2022-02-17 DIAGNOSIS — E1165 Type 2 diabetes mellitus with hyperglycemia: Secondary | ICD-10-CM | POA: Diagnosis not present

## 2022-02-17 DIAGNOSIS — I10 Essential (primary) hypertension: Secondary | ICD-10-CM | POA: Diagnosis not present

## 2022-02-17 DIAGNOSIS — Z01818 Encounter for other preprocedural examination: Secondary | ICD-10-CM

## 2022-02-17 DIAGNOSIS — E785 Hyperlipidemia, unspecified: Secondary | ICD-10-CM

## 2022-02-17 DIAGNOSIS — I82431 Acute embolism and thrombosis of right popliteal vein: Secondary | ICD-10-CM

## 2022-02-17 DIAGNOSIS — E1169 Type 2 diabetes mellitus with other specified complication: Secondary | ICD-10-CM | POA: Diagnosis not present

## 2022-02-17 LAB — CBC WITH DIFFERENTIAL/PLATELET
Basophils Absolute: 0 10*3/uL (ref 0.0–0.1)
Basophils Relative: 0.4 % (ref 0.0–3.0)
Eosinophils Absolute: 0.8 10*3/uL — ABNORMAL HIGH (ref 0.0–0.7)
Eosinophils Relative: 11.6 % — ABNORMAL HIGH (ref 0.0–5.0)
HCT: 39 % (ref 39.0–52.0)
Hemoglobin: 12.9 g/dL — ABNORMAL LOW (ref 13.0–17.0)
Lymphocytes Relative: 22.1 % (ref 12.0–46.0)
Lymphs Abs: 1.6 10*3/uL (ref 0.7–4.0)
MCHC: 33 g/dL (ref 30.0–36.0)
MCV: 90.3 fl (ref 78.0–100.0)
Monocytes Absolute: 0.5 10*3/uL (ref 0.1–1.0)
Monocytes Relative: 7.2 % (ref 3.0–12.0)
Neutro Abs: 4.3 10*3/uL (ref 1.4–7.7)
Neutrophils Relative %: 58.7 % (ref 43.0–77.0)
Platelets: 203 10*3/uL (ref 150.0–400.0)
RBC: 4.31 Mil/uL (ref 4.22–5.81)
RDW: 13.9 % (ref 11.5–15.5)
WBC: 7.3 10*3/uL (ref 4.0–10.5)

## 2022-02-17 LAB — COMPREHENSIVE METABOLIC PANEL
ALT: 15 U/L (ref 0–53)
AST: 14 U/L (ref 0–37)
Albumin: 4.3 g/dL (ref 3.5–5.2)
Alkaline Phosphatase: 57 U/L (ref 39–117)
BUN: 30 mg/dL — ABNORMAL HIGH (ref 6–23)
CO2: 27 mEq/L (ref 19–32)
Calcium: 9.1 mg/dL (ref 8.4–10.5)
Chloride: 106 mEq/L (ref 96–112)
Creatinine, Ser: 2.28 mg/dL — ABNORMAL HIGH (ref 0.40–1.50)
GFR: 28.35 mL/min — ABNORMAL LOW (ref >60.00)
Glucose, Bld: 153 mg/dL — ABNORMAL HIGH (ref 70–99)
Potassium: 5.2 mEq/L — ABNORMAL HIGH (ref 3.5–5.1)
Sodium: 139 mEq/L (ref 135–145)
Total Bilirubin: 0.5 mg/dL (ref 0.2–1.2)
Total Protein: 6.7 g/dL (ref 6.0–8.3)

## 2022-02-17 LAB — LIPID PANEL
Cholesterol: 93 mg/dL (ref 0–200)
HDL: 30.5 mg/dL — ABNORMAL LOW (ref >39.00)
LDL Cholesterol: 52 mg/dL (ref 0–99)
NonHDL: 62.5
Total CHOL/HDL Ratio: 3
Triglycerides: 53 mg/dL (ref 0.0–149.0)
VLDL: 10.6 mg/dL (ref 0.0–40.0)

## 2022-02-17 MED ORDER — SODIUM POLYSTYRENE SULFONATE 15 GM/60ML PO SUSP: ORAL | 0 refills | Status: DC

## 2022-02-17 NOTE — Addendum Note (Signed)
Addended by: Anabel Halon on: 02/17/2022 08:07 PM   Modules accepted: Orders

## 2022-02-17 NOTE — Progress Notes (Signed)
Subjective:    Patient ID: Craig Baldwin, male    DOB: 1951-10-16, 71 y.o.   MRN: [id number]  HPI   Pt in for preop evaluation. Appointment/scheduled for toot extraction and cyst biopsy. Pt states he is not sure how long procedure would be. Pt thinks it would be at oral surgeon office. Hx of various procedure done under general anesthesia.  Negative review of systems.  No reported chest pain or shortness of breath today. But he doe report intermittent episodes of very transient chest pain lasting briefly and intermittent rare. No associated cardiac type symptoms. Pain occurring at rest.  Pt has hx of partially-occluding DVT within the RIGHT popliteal vein.  Impressin and Plan.  "Impression and Plan: Craig Baldwin is a very pleasant 71 yo caucasian gentleman with diagnosis of acute DVT in the right popliteal vein in September 2022. 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 He will continue his same regimen with Eliquis.  Follow-up and repeat US in 4 months."  Pt is diabetic. Last a1c was 6.9 on 02-04-2022. Pt on jardince and amaryl.       Review of Systems  Constitutional:  Negative for chills and fatigue.  HENT:  Negative for congestion and drooling.   Respiratory:  Negative for cough, chest tightness, shortness of breath and wheezing.   Cardiovascular:  Negative for chest pain and palpitations.  Gastrointestinal:  Negative for abdominal distention, abdominal pain, constipation, diarrhea and rectal pain.  Genitourinary:  Negative for dysuria.  Musculoskeletal:  Negative for back pain and joint swelling.  Skin:  Negative for rash.  Neurological:  Negative for dizziness, seizures and headaches.  Hematological:  Negative for adenopathy. Does not bruise/bleed easily.  Psychiatric/Behavioral:  Negative for behavioral problems and confusion.     Past Medical History:  Diagnosis Date   Chronic kidney disease    Diabetes mellitus without complication  ([place])    Hyperlipidemia    Hypertension      Social History   Socioeconomic History   Marital status: Married    Spouse name: Not on file   Number of children: Not on file   Years of education: Not on file   Highest education level: Not on file  Occupational History   Not on file  Tobacco Use   Smoking status: Former    Types: Cigarettes    Quit date: 12/26/1996    Years since quitting: 25.1   Smokeless tobacco: Former    Types: Chew    Quit date: 12/28/2017  Vaping Use   Vaping Use: Never used  Substance and Sexual Activity   Alcohol use: Not on file   Drug use: Not on file   Sexual activity: Not on file  Other Topics Concern   Not on file  Social History Narrative   Not on file   Social Determinants of Health   Financial Resource Strain: Not on file  Food Insecurity: Not on file  Transportation Needs: Not on file  Physical Activity: Not on file  Stress: Not on file  Social Connections: Not on file  Intimate Partner Violence: Not on file    No past surgical history on file.  No family history on file.  Allergies  Allergen Reactions   Amoxicillin Itching    Current Outpatient Medications on File Prior to Visit  Medication Sig Dispense Refill   apixaban (ELIQUIS) 5 MG TABS tablet Take 1 tablet (5 mg total) by mouth 2 (two) times daily. 180 tablet 0  atorvastatin (LIPITOR) 10 MG tablet Take 1 tablet (10 mg total) by mouth daily. 30 tablet 3   Barberry-Oreg Grape-Goldenseal (BERBERINE COMPLEX PO) Take 1,500 mg by mouth in the morning and at bedtime.     benazepril (LOTENSIN) 10 MG tablet Take 1 tablet by mouth once daily 90 tablet 1   cholecalciferol (VITAMIN D3) 25 MCG (1000 UNIT) tablet Take 1,000 Units by mouth daily.     CINNAMON PO Take by mouth 2 (two) times daily.     empagliflozin (JARDIANCE) 10 MG TABS tablet Take 1 tablet (10 mg total) by mouth daily. 90 tablet 3   glimepiride (AMARYL) 1 MG tablet Take 1 tablet (1 mg total) by mouth daily with  breakfast. 90 tablet 3   OVER THE COUNTER MEDICATION      tamsulosin (FLOMAX) 0.4 MG CAPS capsule Take 1 capsule (0.4 mg total) by mouth daily after supper. 30 capsule 6   No current facility-administered medications on file prior to visit.    BP 126/69    Pulse 66    Temp 98.2 F (36.8 C)    Resp 18    Ht 5\' 11"  (1.803 m)    Wt 237 lb 9.6 oz (107.8 kg)    SpO2 96%    BMI 33.14 kg/m       Objective:   Physical Exam  General Mental Status- Alert. General Appearance- Not in acute distress.   Skin General: Color- Normal Color. Moisture- Normal Moisture.  Neck Carotid Arteries- Normal color. Moisture- Normal Moisture. No carotid bruits. No JVD.  Chest and Lung Exam Auscultation: Breath Sounds:-Normal.  Cardiovascular Auscultation:Rythm- Regular. Murmurs & Other Heart Sounds:Auscultation of the heart reveals- No Murmurs.  Abdomen Inspection:-Inspeection Normal. Palpation/Percussion:Note:No mass. Palpation and Percussion of the abdomen reveal- Non Tender, Non Distended + BS, no rebound or guarding.   Neurologic Cranial Nerve exam:- CN III-XII intact(No nystagmus), symmetric smile. Strength:- 5/5 equal and symmetric strength both upper and lower extremities.   Lower extremity-no pedal edema.     Assessment & Plan:   Patient Instructions  Pre-op physical exam for tooth extraction and cyst biopsy and maxillary region.  Procedure will be under IV sedation.  EKG done today for preop exam, hypertension and hyperlipidemia diagnosis.  EKG shows mild sinus bradycardia.  No prior EKG to compare to.  History of recent DVT and on Eliquis per hematology.  I will reach out to hematology to see if they would approve holding Eliquis prior to surgery.  Diabetes-2 weeks ago A1c was 6.9.  Continue Jardiance and Amaryl.  Chronic kidney disease-we will get metabolic panel to assess kidney function.  High cholesterol-patient is fasting so we will go ahead and get lipid panel.  As part  of preop work-up will also include CBC,  Follow labs and get clearance regarding Eliquis from hematology.  Then fill out preop/clearance form for surgery.    Mackie Pai, PA-C

## 2022-02-17 NOTE — Telephone Encounter (Signed)
Craig Baldwin,  Pt is getting scheduled for toot extraction and biopsy of right maxillary cyst under IV sedation.  Wanted to know from your perspective can Eliquis be held prior to the procedure and for how many days if okay?  Would you recommend 1 to 2 days or more? Below is a portion of the note when you saw patient.  "Impression and Plan: [name] is a very pleasant 71 yo caucasian gentleman with diagnosis of acute DVT in the right popliteal vein in September 2022. 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 He will continue his same regimen with Eliquis.  Follow-up and repeat US in 4 months."  Thanks,  Mackie Pai, PA-C

## 2022-02-17 NOTE — Patient Instructions (Addendum)
Pre-op physical exam for tooth extraction and cyst biopsy and maxillary region.  Procedure will be under IV sedation.  EKG done today for preop exam, hypertension and hyperlipidemia diagnosis.  EKG shows mild sinus bradycardia.  No prior EKG to compare to.  History of recent DVT and on Eliquis per hematology.  I will reach out to hematology to see if they would approve holding Eliquis prior to surgery.  Diabetes-2 weeks ago A1c was 6.9.  Continue Jardiance and Amaryl.  Chronic kidney disease-we will get metabolic panel to assess kidney function.  High cholesterol-patient is fasting so we will go ahead and get lipid panel.  As part of preop work-up will also include CBC,  Follow labs and get clearance regarding Eliquis from hematology.  Then fill out preop/clearance form for surgery.  Will update you when labs back and when hematologist responds.

## 2022-02-24 ENCOUNTER — Telehealth: Payer: Self-pay | Admitting: Medical

## 2022-02-24 MED ORDER — ATORVASTATIN CALCIUM 10 MG PO TABS: 10.0000 mg | ORAL_TABLET | Freq: Every day | ORAL | 3 refills | Status: DC

## 2022-02-24 NOTE — Addendum Note (Signed)
Addended by: Anabel Halon on: 02/24/2022 08:12 PM   Modules accepted: Orders

## 2022-02-24 NOTE — Telephone Encounter (Signed)
Patient states he no longer see Dr. Alisia Ferrari and would like to know if Percell Miller would continue to prescribe his Flomax. Please advise.  ? ? ?Medication: atorvastatin (LIPITOR) 10 MG tablet  ? ?Has the patient contacted their pharmacy? Yes.   ?Patient would like a 90 day supply instead.  ? ?Preferred Pharmacy (with phone number or street name):  ?[facility]  ?[place], [place] [address]  ?Phone:  [phone]  Fax:  [phone]  ? ?Agent: Please be advised that RX refills may take up to 3 business days. We ask that you follow-up with your pharmacy.  ?

## 2022-02-25 ENCOUNTER — Telehealth: Payer: Self-pay | Admitting: Medical

## 2022-02-25 NOTE — Telephone Encounter (Signed)
Pt has been experiencing stomach cramps in the morning's but goes away when he eats   ? ?Pt states he recently changed meds prescribed by [facility] ? ?Please advise  ?

## 2022-02-25 NOTE — Telephone Encounter (Signed)
Patient would like to communicate through mychart. Message has been sent  ?

## 2022-02-25 NOTE — Telephone Encounter (Signed)
Could stomach cramps be from the [place]? ?

## 2022-02-25 NOTE — Telephone Encounter (Signed)
Opened in error

## 2022-02-26 ENCOUNTER — Telehealth (HOSPITAL_COMMUNITY): Payer: Self-pay | Admitting: *Deleted

## 2022-02-26 ENCOUNTER — Encounter: Payer: Self-pay | Admitting: Cardiology

## 2022-02-26 ENCOUNTER — Ambulatory Visit: Payer: Medicare HMO | Admitting: Cardiology

## 2022-02-26 ENCOUNTER — Encounter: Payer: Self-pay | Admitting: *Deleted

## 2022-02-26 ENCOUNTER — Other Ambulatory Visit: Payer: Self-pay

## 2022-02-26 VITALS — BP 126/70 | HR 81 | Ht 71.0 in | Wt 235.8 lb

## 2022-02-26 DIAGNOSIS — R079 Chest pain, unspecified: Secondary | ICD-10-CM

## 2022-02-26 DIAGNOSIS — Z01818 Encounter for other preprocedural examination: Secondary | ICD-10-CM

## 2022-02-26 DIAGNOSIS — I82431 Acute embolism and thrombosis of right popliteal vein: Secondary | ICD-10-CM | POA: Diagnosis not present

## 2022-02-26 DIAGNOSIS — Z01810 Encounter for preprocedural cardiovascular examination: Secondary | ICD-10-CM | POA: Diagnosis not present

## 2022-02-26 DIAGNOSIS — N1831 Chronic kidney disease, stage 3a: Secondary | ICD-10-CM | POA: Diagnosis not present

## 2022-02-26 DIAGNOSIS — I10 Essential (primary) hypertension: Secondary | ICD-10-CM

## 2022-02-26 DIAGNOSIS — D6851 Activated protein C resistance: Secondary | ICD-10-CM | POA: Insufficient documentation

## 2022-02-26 NOTE — Assessment & Plan Note (Signed)
We will go ahead and check a coronary calcium score $9.  I will also check a stress test Lexiscan.  Originally I want to do a coronary CT scan but his creatinine was 2.2.  Unable to utilize contrast.  If coronary plaque is present, we will likely switch him over to Crestor 20 mg for high intensity statin and to lower his LDL to less than 70.  It is likely that his chest discomfort was musculoskeletal or perhaps GI related.  However with diabetes I am always concerned about coronary disease equivalent. ?

## 2022-02-26 NOTE — Assessment & Plan Note (Signed)
Hematology note reviewed.  On Eliquis. ?

## 2022-02-26 NOTE — Assessment & Plan Note (Signed)
After stress test is completed as well as coronary calcium score, if everything is low risk, he may proceed with gum surgery. ?

## 2022-02-26 NOTE — Assessment & Plan Note (Signed)
Creatinine 2.28.  Avoid NSAIDs.  Diabetes control.  Blood pressure control.  He is on benazepril 10 mg for further renal protection. ?

## 2022-02-26 NOTE — Assessment & Plan Note (Signed)
On Eliquis directed by hematology.  Factor V Leiden deficiency. ?

## 2022-02-26 NOTE — Progress Notes (Signed)
Cardiology Office Note:    Date:  02/26/2022   ID:  Craig Baldwin, DOB [birth year], MRN [id number]  PCP:  Elise Benne   [facility] Providers Cardiologist:  None     Referring MD: Mackie Pai, PA-C    History of Present Illness:    Craig Baldwin is a 71 y.o. male here for the evaluation of chest pain at the request of [place], [place].  There was a sharp pain and went away (chalked it up to gas). Happened when sitting. Happened maybe twice today. No SOB with hills, no pressure. Was worried about PE. Happened 3 weeks ago.   Has a partially occluding DVT within the right popliteal vein diagnosed in September 2022, heterozygous factor V Leiden, on Eliquis with plan to follow-up and repeat ultrasound in 4 months.  This has been managed by hematology.  Has chronic kidney disease stage IIIb with prior creatinine 2.28.  Hemoglobin A1c with diabetes was 6.9 in February 2023.  On Jardiance and Amaryl.  Sees endocrinology.  Former smoker quit 1988. Mother lung CA Father DM, no early CAD, died toe infection.   Here for preop for gum surgery as well.   Past Medical History:  Diagnosis Date   Chronic kidney disease    Diabetes mellitus without complication ([place])    Hyperlipidemia    Hypertension     History reviewed. No pertinent surgical history.  Current Medications: Current Meds  Medication Sig   apixaban (ELIQUIS) 5 MG TABS tablet Take 1 tablet (5 mg total) by mouth 2 (two) times daily.   atorvastatin (LIPITOR) 10 MG tablet Take 1 tablet (10 mg total) by mouth daily.   Barberry-Oreg Grape-Goldenseal (BERBERINE COMPLEX PO) Take 1,500 mg by mouth in the morning and at bedtime.   benazepril (LOTENSIN) 10 MG tablet Take 1 tablet by mouth once daily   cholecalciferol (VITAMIN D3) 25 MCG (1000 UNIT) tablet Take 1,000 Units by mouth daily.   CINNAMON PO Take by mouth 2 (two) times daily.   empagliflozin (JARDIANCE) 10 MG TABS tablet Take 1 tablet (10 mg total) by mouth  daily.   OVER THE COUNTER MEDICATION    tamsulosin (FLOMAX) 0.4 MG CAPS capsule Take 1 capsule (0.4 mg total) by mouth daily after supper.   triamcinolone ointment (KENALOG) 0.1 % Apply topically 2 (two) times daily as needed.     Allergies:   Amoxicillin   Social History   Socioeconomic History   Marital status: Married    Spouse name: Not on file   Number of children: Not on file   Years of education: Not on file   Highest education level: Not on file  Occupational History   Not on file  Tobacco Use   Smoking status: Former    Types: Cigarettes    Quit date: 12/26/1996    Years since quitting: 25.1   Smokeless tobacco: Former    Types: Chew    Quit date: 12/28/2017  Vaping Use   Vaping Use: Never used  Substance and Sexual Activity   Alcohol use: Not on file   Drug use: Not on file   Sexual activity: Not on file  Other Topics Concern   Not on file  Social History Narrative   Not on file   Social Determinants of Health   Financial Resource Strain: Not on file  Food Insecurity: Not on file  Transportation Needs: Not on file  Physical Activity: Not on file  Stress: Not on file  Social Connections: Not on  file     Family History: The patient's family history is not on file.  ROS:   Please see the history of present illness.     All other systems reviewed and are negative.  EKGs/Labs/Other Studies Reviewed:    The following studies were reviewed today: Vascular ultrasound 12/24/2021: Residual, partially-occluding DVT within the RIGHT popliteal vein  EKG:  02/17/2022 shows sinus bradycardia 57 with poor R wave progression personally reviewed and interpreted  Recent Labs: 02/17/2022: ALT 15; BUN 30; Creatinine, Ser 2.28; Hemoglobin 12.9; Platelets 203.0; Potassium 5.2 No hemolysis seen; Sodium 139  Recent Lipid Panel    Component Value Date/Time   CHOL 93 02/17/2022 0842   CHOL 159 10/08/2021 0852   TRIG 53.0 02/17/2022 0842   HDL 30.50 (L) 02/17/2022 0842    HDL 28 (L) 10/08/2021 0852   CHOLHDL 3 02/17/2022 0842   VLDL 10.6 02/17/2022 0842   LDLCALC 52 02/17/2022 0842   LDLCALC 95 10/08/2021 0852     Risk Assessment/Calculations:              Physical Exam:    VS:  BP 126/70    Pulse 81    Ht '5\' 11"'$  (1.803 m)    Wt 235 lb 12.8 oz (107 kg)    SpO2 98%    BMI 32.89 kg/m     Wt Readings from Last 3 Encounters:  02/26/22 235 lb 12.8 oz (107 kg)  02/17/22 237 lb 9.6 oz (107.8 kg)  02/04/22 239 lb (108.4 kg)     GEN:  Well nourished, well developed in no acute distress HEENT: Normal NECK: No JVD; No carotid bruits LYMPHATICS: No lymphadenopathy CARDIAC: RRR, no murmurs, no rubs, gallops RESPIRATORY:  Clear to auscultation without rales, wheezing or rhonchi  ABDOMEN: Soft, non-tender, non-distended MUSCULOSKELETAL:  No edema; No deformity  SKIN: Warm and dry NEUROLOGIC:  Alert and oriented x 3 PSYCHIATRIC:  Normal affect   ASSESSMENT:    1. Essential hypertension   2. Chest pain of uncertain etiology   3. Pre-op evaluation   4. Stage 3a chronic kidney disease ([place])   5. Preop cardiovascular exam   6. Acute deep vein thrombosis (DVT) of popliteal vein of right lower extremity (HCC)   7. Factor V Leiden ([place])    PLAN:    In order of problems listed above:  Chest pain of uncertain etiology We will go ahead and check a coronary calcium score $9.  I will also check a stress test Lexiscan.  Originally I want to do a coronary CT scan but his creatinine was 2.2.  Unable to utilize contrast.  If coronary plaque is present, we will likely switch him over to Crestor 20 mg for high intensity statin and to lower his LDL to less than 70.  It is likely that his chest discomfort was musculoskeletal or perhaps GI related.  However with diabetes I am always concerned about coronary disease equivalent.  CKD (chronic kidney disease), stage III Creatinine 2.28.  Avoid NSAIDs.  Diabetes control.  Blood pressure control.  He is on benazepril 10  mg for further renal protection.  Preop cardiovascular exam After stress test is completed as well as coronary calcium score, if everything is low risk, he may proceed with gum surgery.  Acute deep vein thrombosis (DVT) of popliteal vein of right lower extremity (HCC) On Eliquis directed by hematology.  Factor V Leiden deficiency.  Factor V Leiden [facility]) Hematology note reviewed.  On Eliquis.  Medication Adjustments/Labs and Tests Ordered: Current medicines are reviewed at length with the patient today.  Concerns regarding medicines are outlined above.  Orders Placed This Encounter  Procedures   CT CARDIAC SCORING (SELF PAY ONLY)   MYOCARDIAL PERFUSION IMAGING   No orders of the defined types were placed in this encounter.   Patient Instructions  Medication Instructions:  The current medical regimen is effective;  continue present plan and medications.  *If you need a refill on your cardiac medications before your next appointment, please call your pharmacy*  Testing/Procedures: Your physician has requested that you have a Coronary Calcium score which is completed by CT. Cardiac computed tomography (CT) is a painless test that uses an x-ray machine to take clear, detailed pictures of your heart. There are no instructions for this testing.  You may eat/drink and take your normal medications this day.  The cost of the testing is $99 due at the time of your appointment.  Your physician has requested that you have a lexiscan myoview. For further information please visit [email or web address]. Please follow instruction sheet, as given.  Follow-Up: At [facility], you and your health needs are our priority.  As part of our continuing mission to provide you with exceptional heart care, we have created designated Provider Care Teams.  These Care Teams include your primary Cardiologist (physician) and Advanced Practice Providers (APPs -  Physician Assistants and Nurse  Practitioners) who all work together to provide you with the care you need, when you need it.  We recommend signing up for the patient portal called "MyChart".  Sign up information is provided on this After Visit Summary.  MyChart is used to connect with patients for Virtual Visits (Telemedicine).  Patients are able to view lab/test results, encounter notes, upcoming appointments, etc.  Non-urgent messages can be sent to your provider as well.   To learn more about what you can do with MyChart, go to [email or web address].    Your next appointment:   Follow up will be determined after the above testing.  If primary card or EP is not listed click here to update    :1}   Thank you for choosing [facility]!!      Signed, Candee Furbish, MD  02/26/2022 10:25 AM    [place]

## 2022-02-26 NOTE — Patient Instructions (Signed)
Medication Instructions:  ?The current medical regimen is effective;  continue present plan and medications. ? ?*If you need a refill on your cardiac medications before your next appointment, please call your pharmacy* ? ?Testing/Procedures: ?Your physician has requested that you have a Coronary Calcium score which is completed by CT. Cardiac computed tomography (CT) is a painless test that uses an x-ray machine to take clear, detailed pictures of your heart. There are no instructions for this testing.  You may eat/drink and take your normal medications this day.  The cost of the testing is $99 due at the time of your appointment. ? ?Your physician has requested that you have a lexiscan myoview. For further information please visit [email or web address]. Please follow instruction sheet, as given. ? ?Follow-Up: ?At [facility], you and your health needs are our priority.  As part of our continuing mission to provide you with exceptional heart care, we have created designated Provider Care Teams.  These Care Teams include your primary Cardiologist (physician) and Advanced Practice Providers (APPs -  Physician Assistants and Nurse Practitioners) who all work together to provide you with the care you need, when you need it. ? ?We recommend signing up for the patient portal called "MyChart".  Sign up information is provided on this After Visit Summary.  MyChart is used to connect with patients for Virtual Visits (Telemedicine).  Patients are able to view lab/test results, encounter notes, upcoming appointments, etc.  Non-urgent messages can be sent to your provider as well.   ?To learn more about what you can do with MyChart, go to [email or web address].   ? ?Your next appointment:   ?Follow up will be determined after the above testing. ? ?If primary card or EP is not listed click here to update    :1}  ? ?Thank you for choosing [facility]!! ? ? ? ?

## 2022-02-26 NOTE — Telephone Encounter (Signed)
Left message on voicemail per DPR in reference to upcoming appointment scheduled on 03/05/2022 at 8:00 with detailed instructions given per Myocardial Perfusion Study Information Sheet for the test. LM to arrive 15 minutes early, and that it is imperative to arrive on time for appointment to keep from having the test rescheduled. If you need to cancel or reschedule your appointment, please call the office within 24 hours of your appointment. Failure to do so may result in a cancellation of your appointment, and a $50 no show fee. Phone number given for call back for any questions.  ? ?

## 2022-03-03 ENCOUNTER — Telehealth: Payer: Self-pay | Admitting: Medical

## 2022-03-03 MED ORDER — BENAZEPRIL HCL 10 MG PO TABS: 10.0000 mg | ORAL_TABLET | Freq: Every day | ORAL | 1 refills | Status: DC

## 2022-03-03 NOTE — Telephone Encounter (Addendum)
Pt is requesting meds refill for Benazepril 10 MG ? ?Pt no longer see's [employer] and pharmacy provided a 3 days supply  ? ?Please advise   ? ? ?Rx refill sent to pharmacy. ? ?Mackie Pai, PA-C  ?

## 2022-03-05 ENCOUNTER — Other Ambulatory Visit: Payer: Self-pay

## 2022-03-05 ENCOUNTER — Ambulatory Visit (INDEPENDENT_AMBULATORY_CARE_PROVIDER_SITE_OTHER)
Admission: RE | Admit: 2022-03-05 | Discharge: 2022-03-05 | Disposition: A | Discharge status: Home / Self Care | Payer: Self-pay | Source: Ambulatory Visit | Attending: Cardiology | Admitting: Cardiology

## 2022-03-05 ENCOUNTER — Ambulatory Visit (HOSPITAL_COMMUNITY): Payer: Medicare HMO | Attending: Cardiology

## 2022-03-05 DIAGNOSIS — R079 Chest pain, unspecified: Secondary | ICD-10-CM | POA: Insufficient documentation

## 2022-03-05 DIAGNOSIS — Z01818 Encounter for other preprocedural examination: Secondary | ICD-10-CM | POA: Diagnosis not present

## 2022-03-05 LAB — MYOCARDIAL PERFUSION IMAGING
LV dias vol: 103 mL (ref 62–150)
LV sys vol: 44 mL
Nuc Stress EF: 57 %
Peak HR: 73 {beats}/min
Rest HR: 53 {beats}/min
Rest Nuclear Isotope Dose: 10.9 mCi
SDS: 0
SRS: 0
SSS: 0
ST Depression (mm): 0 mm
Stress Nuclear Isotope Dose: 31.9 mCi
TID: 1.04

## 2022-03-05 MED ORDER — REGADENOSON 0.4 MG/5ML IV SOLN
0.4000 mg | Freq: Once | INTRAVENOUS | Status: AC
  Administered 2022-03-05: 0.4 mg via INTRAVENOUS

## 2022-03-05 MED ORDER — TECHNETIUM TC 99M TETROFOSMIN IV KIT
31.9000 | PACK | Freq: Once | INTRAVENOUS | Status: AC | PRN
  Administered 2022-03-05: 31.9 via INTRAVENOUS
  Filled 2022-03-05: qty 32

## 2022-03-05 MED ORDER — TECHNETIUM TC 99M TETROFOSMIN IV KIT
10.9000 | PACK | Freq: Once | INTRAVENOUS | Status: AC | PRN
  Administered 2022-03-05: 10.9 via INTRAVENOUS
  Filled 2022-03-05: qty 11

## 2022-03-06 ENCOUNTER — Telehealth: Payer: Self-pay | Admitting: Medical

## 2022-03-06 NOTE — Telephone Encounter (Signed)
Pt states he has been having severe stomach pains and did have black tar like stool at one point. Another time it was also green, he was transferred to triage.  ?

## 2022-03-07 ENCOUNTER — Telehealth: Payer: Self-pay | Admitting: *Deleted

## 2022-03-07 ENCOUNTER — Telehealth: Payer: Self-pay

## 2022-03-07 DIAGNOSIS — E782 Mixed hyperlipidemia: Secondary | ICD-10-CM

## 2022-03-07 DIAGNOSIS — Z79899 Other long term (current) drug therapy: Secondary | ICD-10-CM

## 2022-03-07 MED ORDER — ROSUVASTATIN CALCIUM 20 MG PO TABS: 20.0000 mg | ORAL_TABLET | Freq: Every day | ORAL | 3 refills | Status: DC

## 2022-03-07 NOTE — Telephone Encounter (Signed)
Pt called and he had questions about surgical clearance for surgery ?

## 2022-03-07 NOTE — Telephone Encounter (Signed)
Pt.notified

## 2022-03-07 NOTE — Telephone Encounter (Addendum)
Offered pt appointment today stated he is unable to come in because he is on trip for work , wants OTC recommendations ? ? ?Without appointment/limited information can try pepcid over the counter, imodium for diarrhea and hydrate well. If symptoms not improving or worsening then advise  local urgent care or ED evaluation ?

## 2022-03-07 NOTE — Telephone Encounter (Signed)
Pt would like a call about medications ?

## 2022-03-07 NOTE — Telephone Encounter (Signed)
Surgical clearance form faxed to [facility] at [phone] ?Also made [place] [facility] aware that pt has been referred to cardiology ?

## 2022-03-07 NOTE — Telephone Encounter (Signed)
Initial Comment Caller states he has been having stomach issues for ?the last week and a half. Caller states he has been ?having diarrhea and his stomach is burning and ?today he had a green bowel movement. ?Translation No ?Disp. Time (Eastern ?Time) Disposition Final User ?03/06/2022 4:49:59 PM Attempt made - message left Marzetta Board, RN, Verline Lema ?03/06/2022 5:11:43 PM Attempt made - message left Marzetta Board, RN, Verline Lema ?03/06/2022 5:29:29 PM FINAL ATTEMPT MADE - ?message left ?Yes Marzetta Board, RN, Verline Lema ?Comments ?User: Rudene Christians, RN Date/Time Eilene Ghazi Time): 03/06/2022 4:49:47 PM ?This RN LVM on primary number, but the secondary number stated she does not know anyone by the name of ?Betzalel, will verify secondary number with PC. ?User: Rudene Christians, RN Date/Time Eilene Ghazi Time): 03/06/2022 5:29:19 PM ?This RN verified secondary phone number with PC ?

## 2022-03-07 NOTE — Telephone Encounter (Signed)
Coronary calcium score is 28, calcified plaque noted in the left anterior descending artery.  ?Lets go ahead and switch his atorvastatin 10 mg over to Crestor 20 mg.  Recheck lipid panel in 3 months.  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  We will continue to monitor.  ?Candee Furbish, MD  ? ?Pt aware of results and agreeable to change med.  Rx to be sent into [facility].  Lipid/ALT to be drawn 6/23. ? ?Pt reports he is being scheduled for gum surgery by an oral surgeon in [facility].  He is unsure if he needs clearance or not.  Advised if oral surgeon needs to hold Eliquis, he will need to contact the office with type of procedure and length of time him wishes to hold medication.  Pt states understanding.   ?

## 2022-03-07 NOTE — Telephone Encounter (Signed)
? ?  Pre-operative Risk Assessment  ?  ?Patient Name: Craig Baldwin  ?DOB: [birth year] ?MRN: [id number]  ? ? ?[place] FAXED TO [place] 01/02/22; [facility] THIS IS THE FIRST FAX WE HAVE RECEIVED. ?Request for Surgical Clearance   ? ?Procedure:   1 TOOTH TO BE SURGICALLY EXTRACTED (TOOTH # 12 LOCATION IN MOUTH)  ;ALSO WILL HAVE Bx OF CYST OF MAXILLA ? ?Date of Surgery:  Clearance TBD                              ?   ?Surgeon:  DR. Natividad Brood ?Surgeon's Group or Practice Name:  [facility] ?Phone number:  [phone] ?Fax number:  [phone] ?  ?Type of Clearance Requested:   ?- Medical  ?- Pharmacy:  Hold Apixaban (Eliquis)   DR. Lenny Pastel CONCERNS AS PT HAD RECENT DVT 6 WEEKS AGO ?  ?Type of Anesthesia:   IV SEDATION ?  ?Additional requests/questions:  Please fax a copy of LAST OV NOTE AND LAB WORK REQUESTING A1C (CARDIOLOGY DOES CHECK A1C GENERALLY) IF WANT A1C MAY NEED TO CONTACT PCP to the surgeon's office. ? ?Signed, ?Julaine Hua   ?03/07/2022, 4:11 PM  ?

## 2022-03-12 NOTE — Telephone Encounter (Signed)
Patient has history of partially occluding DVT, diagnosed 9/22.  Also heterozygous FVL.  Is followed by hematology for this.  Would defer to them as they are managing and determining length of anticoagulation ?

## 2022-03-13 NOTE — Telephone Encounter (Signed)
? ?  Primary Cardiologist: Candee Furbish, MD ? ?Chart reviewed as part of pre-operative protocol coverage. Simple dental extractions are considered low risk procedures per guidelines and generally do not require any specific cardiac clearance. It is also generally accepted that for simple extractions and dental cleanings, there is no need to interrupt blood thinner therapy.  ? ?Patient has history of partially occluding DVT, diagnosed 9/22.  Also heterozygous FVL. He is followed by hematology for this.  I will defer to them as they are managing and determining length of anticoagulation ? ?SBE prophylaxis is not required for the patient. ? ?I will route this recommendation to the requesting party via Epic fax function and remove from pre-op pool. ? ?Please call with questions. ? ?Deberah Pelton, NP ?03/13/2022, 9:15 AM ? ? ? ? ?

## 2022-03-25 DIAGNOSIS — K045 Chronic apical periodontitis: Secondary | ICD-10-CM | POA: Diagnosis not present

## 2022-04-05 DIAGNOSIS — E119 Type 2 diabetes mellitus without complications: Secondary | ICD-10-CM | POA: Diagnosis not present

## 2022-04-05 DIAGNOSIS — I10 Essential (primary) hypertension: Secondary | ICD-10-CM | POA: Diagnosis not present

## 2022-04-05 DIAGNOSIS — E7800 Pure hypercholesterolemia, unspecified: Secondary | ICD-10-CM | POA: Diagnosis not present

## 2022-04-05 DIAGNOSIS — H5200 Hypermetropia, unspecified eye: Secondary | ICD-10-CM | POA: Diagnosis not present

## 2022-04-06 ENCOUNTER — Other Ambulatory Visit: Payer: Self-pay | Admitting: Family

## 2022-04-06 DIAGNOSIS — I82431 Acute embolism and thrombosis of right popliteal vein: Secondary | ICD-10-CM

## 2022-04-18 ENCOUNTER — Other Ambulatory Visit: Payer: Self-pay

## 2022-04-18 ENCOUNTER — Inpatient Hospital Stay (HOSPITAL_BASED_OUTPATIENT_CLINIC_OR_DEPARTMENT_OTHER): Payer: Medicare HMO | Admitting: Family

## 2022-04-18 ENCOUNTER — Encounter: Payer: Self-pay | Admitting: Family

## 2022-04-18 ENCOUNTER — Inpatient Hospital Stay: Payer: Medicare HMO | Attending: Hematology & Oncology

## 2022-04-18 VITALS — BP 112/59 | HR 67 | Temp 98.1°F | Resp 18 | Ht 71.0 in | Wt 235.8 lb

## 2022-04-18 DIAGNOSIS — Z7984 Long term (current) use of oral hypoglycemic drugs: Secondary | ICD-10-CM | POA: Insufficient documentation

## 2022-04-18 DIAGNOSIS — E119 Type 2 diabetes mellitus without complications: Secondary | ICD-10-CM | POA: Insufficient documentation

## 2022-04-18 DIAGNOSIS — I82431 Acute embolism and thrombosis of right popliteal vein: Secondary | ICD-10-CM | POA: Insufficient documentation

## 2022-04-18 DIAGNOSIS — D6859 Other primary thrombophilia: Secondary | ICD-10-CM

## 2022-04-18 DIAGNOSIS — Z7901 Long term (current) use of anticoagulants: Secondary | ICD-10-CM | POA: Insufficient documentation

## 2022-04-18 DIAGNOSIS — Z801 Family history of malignant neoplasm of trachea, bronchus and lung: Secondary | ICD-10-CM | POA: Insufficient documentation

## 2022-04-18 DIAGNOSIS — Z87891 Personal history of nicotine dependence: Secondary | ICD-10-CM | POA: Diagnosis not present

## 2022-04-18 DIAGNOSIS — D6851 Activated protein C resistance: Secondary | ICD-10-CM

## 2022-04-18 LAB — CMP (CANCER CENTER ONLY)
ALT: 16 U/L (ref 0–44)
AST: 14 U/L — ABNORMAL LOW (ref 15–41)
Albumin: 4.1 g/dL (ref 3.5–5.0)
Alkaline Phosphatase: 59 U/L (ref 38–126)
Anion gap: 9 (ref 5–15)
BUN: 44 mg/dL — ABNORMAL HIGH (ref 8–23)
CO2: 23 mmol/L (ref 22–32)
Calcium: 9.2 mg/dL (ref 8.9–10.3)
Chloride: 104 mmol/L (ref 98–111)
Creatinine: 2.37 mg/dL — ABNORMAL HIGH (ref 0.61–1.24)
GFR, Estimated: 29 mL/min — ABNORMAL LOW (ref >60)
Glucose, Bld: 169 mg/dL — ABNORMAL HIGH (ref 70–99)
Potassium: 5.1 mmol/L (ref 3.5–5.1)
Sodium: 136 mmol/L (ref 135–145)
Total Bilirubin: 0.6 mg/dL (ref 0.3–1.2)
Total Protein: 6.9 g/dL (ref 6.5–8.1)

## 2022-04-18 LAB — CBC WITH DIFFERENTIAL (CANCER CENTER ONLY)
Abs Immature Granulocytes: 0.03 10*3/uL (ref 0.00–0.07)
Basophils Absolute: 0 10*3/uL (ref 0.0–0.1)
Basophils Relative: 0 %
Eosinophils Absolute: 0.3 10*3/uL (ref 0.0–0.5)
Eosinophils Relative: 4 %
HCT: 38.3 % — ABNORMAL LOW (ref 39.0–52.0)
Hemoglobin: 12.8 g/dL — ABNORMAL LOW (ref 13.0–17.0)
Immature Granulocytes: 0 %
Lymphocytes Relative: 24 %
Lymphs Abs: 1.8 10*3/uL (ref 0.7–4.0)
MCH: 30 pg (ref 26.0–34.0)
MCHC: 33.4 g/dL (ref 30.0–36.0)
MCV: 89.7 fL (ref 80.0–100.0)
Monocytes Absolute: 0.6 10*3/uL (ref 0.1–1.0)
Monocytes Relative: 9 %
Neutro Abs: 4.6 10*3/uL (ref 1.7–7.7)
Neutrophils Relative %: 63 %
Platelet Count: 182 10*3/uL (ref 150–400)
RBC: 4.27 MIL/uL (ref 4.22–5.81)
RDW: 13 % (ref 11.5–15.5)
WBC Count: 7.3 10*3/uL (ref 4.0–10.5)
nRBC: 0 % (ref 0.0–0.2)

## 2022-04-18 LAB — D-DIMER, QUANTITATIVE: D-Dimer, Quant: 0.54 ug/mL-FEU — ABNORMAL HIGH (ref 0.00–0.50)

## 2022-04-18 LAB — LACTATE DEHYDROGENASE: LDH: 121 U/L (ref 98–192)

## 2022-04-18 NOTE — Progress Notes (Signed)
?Hematology and Oncology Follow Up Visit ? ?Craig Baldwin ?[id number] ?Mar 15, 1951 71 y.o. ?04/18/2022 ? ? ?Principle Diagnosis:  ?Acute DVT of popliteal vein of right lower extremity ?Heterozygous for Factor V Leiden  ?  ?Current Therapy:        ?Eliquis 5 mg PO BID ?  ?Interim History:  Craig Baldwin is here today for follow-up. He is doing quite well and has no complaints at this time.  ?He has tolerated Eliquis nicely so far and has not noted ant obvious bleeding. No bruising or petechiae.  ?No fever, chills, n/v, cough, rash, dizziness, SOB, chest pain, palpitations, abdominal pain or changes in bowel or bladder habits.  ?No swelling or tenderness in his extremities.  ?He has occasional intermittent tingling in his feet.  ?No falls or syncope to report.  ?He has maintained a good appetite and is staying well hydrated. His weight is stable at 235 lbs.  ? ?ECOG Performance Status: 1 - Symptomatic but completely ambulatory ? ?Medications:  ?Allergies as of 04/18/2022   ? ?   Reactions  ? Amoxicillin Itching  ? ?  ? ?  ?Medication List  ?  ? ?  ? Accurate as of April 18, 2022  9:07 AM. If you have any questions, ask your nurse or doctor.  ?  ?  ? ?  ? ?STOP taking these medications   ? ?glimepiride 1 MG tablet ?Commonly known as: AMARYL ?Stopped by: Lottie Dawson, NP ?  ?OVER THE COUNTER MEDICATION ?Stopped by: Lottie Dawson, NP ?  ?sodium polystyrene 15 GM/60ML suspension ?Commonly known as: KAYEXALATE ?Stopped by: Lottie Dawson, NP ?  ? ?  ? ?TAKE these medications   ? ?benazepril 10 MG tablet ?Commonly known as: LOTENSIN ?Take 1 tablet (10 mg total) by mouth daily. ?  ?BERBERINE COMPLEX PO ?Take 1,500 mg by mouth in the morning and at bedtime. ?  ?cholecalciferol 25 MCG (1000 UNIT) tablet ?Commonly known as: VITAMIN D3 ?Take 1,000 Units by mouth daily. ?  ?CINNAMON PO ?Take by mouth 2 (two) times daily. ?  ?Eliquis 5 MG Tabs tablet ?Generic drug: apixaban ?Take 1 tablet by mouth twice daily ?  ?empagliflozin 10 MG Tabs  tablet ?Commonly known as: JARDIANCE ?Take 1 tablet (10 mg total) by mouth daily. ?  ?rosuvastatin 20 MG tablet ?Commonly known as: CRESTOR ?Take 1 tablet (20 mg total) by mouth daily. ?  ?tamsulosin 0.4 MG Caps capsule ?Commonly known as: FLOMAX ?Take 1 capsule (0.4 mg total) by mouth daily after supper. ?  ?triamcinolone ointment 0.1 % ?Commonly known as: KENALOG ?Apply topically 2 (two) times daily as needed. ?  ? ?  ? ? ?Allergies:  ?Allergies  ?Allergen Reactions  ? Amoxicillin Itching  ? ? ?Past Medical History, Surgical history, Social history, and Family History were reviewed and updated. ? ?Review of Systems: ?All other 10 point review of systems is negative.  ? ?Physical Exam: ? height is '5\' 11"'$  (1.803 m) and weight is 235 lb 12.8 oz (107 kg). His oral temperature is 98.1 ?F (36.7 ?C). His blood pressure is 112/59 (abnormal) and his pulse is 67. His respiration is 18 and oxygen saturation is 96%.  ? ?Wt Readings from Last 3 Encounters:  ?04/18/22 235 lb 12.8 oz (107 kg)  ?03/05/22 235 lb (106.6 kg)  ?02/26/22 235 lb 12.8 oz (107 kg)  ? ? ?Ocular: Sclerae unicteric, pupils equal, round and reactive to light ?Ear-nose-throat: Oropharynx clear, dentition fair ?Lymphatic: No cervical or supraclavicular adenopathy ?Lungs no  rales or rhonchi, good excursion bilaterally ?Heart regular rate and rhythm, no murmur appreciated ?Abd soft, nontender, positive bowel sounds ?MSK no focal spinal tenderness, no joint edema ?Neuro: non-focal, well-oriented, appropriate affect ?Breasts: Deferred  ? ?Lab Results  ?Component Value Date  ? WBC 7.3 04/18/2022  ? HGB 12.8 (L) 04/18/2022  ? HCT 38.3 (L) 04/18/2022  ? MCV 89.7 04/18/2022  ? PLT 182 04/18/2022  ? ?No results found for: FERRITIN, IRON, TIBC, UIBC, IRONPCTSAT ?Lab Results  ?Component Value Date  ? RBC 4.27 04/18/2022  ? ?No results found for: KPAFRELGTCHN, LAMBDASER, KAPLAMBRATIO ?No results found for: IGGSERUM, IGA, IGMSERUM ?No results found for: TOTALPROTELP,  ALBUMINELP, A1GS, A2GS, BETS, BETA2SER, GAMS, MSPIKE, SPEI ?  Chemistry   ?   ?Component Value Date/Time  ? NA 136 04/18/2022 0828  ? NA 135 10/08/2021 0852  ? K 5.1 04/18/2022 0828  ? CL 104 04/18/2022 0828  ? CO2 23 04/18/2022 0828  ? BUN 44 (H) 04/18/2022 2585  ? BUN 23 10/08/2021 0852  ? CREATININE 2.37 (H) 04/18/2022 2778  ? CREATININE 2.39 (H) 06/01/2020 1544  ?    ?Component Value Date/Time  ? CALCIUM 9.2 04/18/2022 0828  ? ALKPHOS 59 04/18/2022 0828  ? AST 14 (L) 04/18/2022 2423  ? ALT 16 04/18/2022 0828  ? BILITOT 0.6 04/18/2022 0828  ?  ? ? ? ?Impression and Plan: Craig Baldwin is a very pleasant 71 yo caucasian gentleman with diagnosis of acute DVT in the right popliteal vein in September 2022. 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 ?He will continue his same regimen with Eliquis.  ?We will go a repeat US at follow-up in 6 months and determine if we can reduce his Eliquis to half dose.  ? ?Lottie Dawson, NP ?4/28/20239:07 AM ? ?

## 2022-04-24 ENCOUNTER — Telehealth: Payer: Self-pay | Admitting: *Deleted

## 2022-04-24 NOTE — Telephone Encounter (Signed)
Per 04/18/22 los - called and lvm of upcoming appointments - requested call back to confirm ?

## 2022-04-25 ENCOUNTER — Ambulatory Visit: Payer: Medicare HMO | Admitting: Family

## 2022-04-25 ENCOUNTER — Other Ambulatory Visit: Payer: Medicare HMO

## 2022-04-28 DIAGNOSIS — Z0100 Encounter for examination of eyes and vision without abnormal findings: Secondary | ICD-10-CM | POA: Diagnosis not present

## 2022-04-28 DIAGNOSIS — E119 Type 2 diabetes mellitus without complications: Secondary | ICD-10-CM | POA: Diagnosis not present

## 2022-04-28 DIAGNOSIS — H2513 Age-related nuclear cataract, bilateral: Secondary | ICD-10-CM | POA: Diagnosis not present

## 2022-04-28 LAB — HM DIABETES EYE EXAM

## 2022-05-10 DIAGNOSIS — E1136 Type 2 diabetes mellitus with diabetic cataract: Secondary | ICD-10-CM | POA: Diagnosis not present

## 2022-05-10 DIAGNOSIS — Z809 Family history of malignant neoplasm, unspecified: Secondary | ICD-10-CM | POA: Diagnosis not present

## 2022-05-10 DIAGNOSIS — Z6832 Body mass index (BMI) 32.0-32.9, adult: Secondary | ICD-10-CM | POA: Diagnosis not present

## 2022-05-10 DIAGNOSIS — N529 Male erectile dysfunction, unspecified: Secondary | ICD-10-CM | POA: Diagnosis not present

## 2022-05-10 DIAGNOSIS — N400 Enlarged prostate without lower urinary tract symptoms: Secondary | ICD-10-CM | POA: Diagnosis not present

## 2022-05-10 DIAGNOSIS — Z7984 Long term (current) use of oral hypoglycemic drugs: Secondary | ICD-10-CM | POA: Diagnosis not present

## 2022-05-10 DIAGNOSIS — E669 Obesity, unspecified: Secondary | ICD-10-CM | POA: Diagnosis not present

## 2022-05-10 DIAGNOSIS — G4730 Sleep apnea, unspecified: Secondary | ICD-10-CM | POA: Diagnosis not present

## 2022-05-10 DIAGNOSIS — E785 Hyperlipidemia, unspecified: Secondary | ICD-10-CM | POA: Diagnosis not present

## 2022-05-10 DIAGNOSIS — I10 Essential (primary) hypertension: Secondary | ICD-10-CM | POA: Diagnosis not present

## 2022-05-10 DIAGNOSIS — Z596 Low income: Secondary | ICD-10-CM | POA: Diagnosis not present

## 2022-05-10 DIAGNOSIS — Z7901 Long term (current) use of anticoagulants: Secondary | ICD-10-CM | POA: Diagnosis not present

## 2022-05-22 ENCOUNTER — Telehealth: Payer: Self-pay | Admitting: Medical

## 2022-05-22 NOTE — Telephone Encounter (Signed)
Pt stated he has being trying to contact the office regarding a rx that he thinks he's suppose to stop taking but has not been successful with speaking with someone

## 2022-05-26 ENCOUNTER — Telehealth: Payer: Self-pay | Admitting: Medical

## 2022-05-26 MED ORDER — TAMSULOSIN HCL 0.4 MG PO CAPS: 0.4000 mg | ORAL_CAPSULE | Freq: Every day | ORAL | 6 refills | Status: DC

## 2022-05-26 NOTE — Telephone Encounter (Signed)
Patient notified and continue Glimpiride

## 2022-05-26 NOTE — Telephone Encounter (Signed)
Patient states that Vania Rea is too expensive. Patient also wants to discuss why he still needs to be on these medication is his numbers controlled. He thinks that he was suppose to stop the Glimepiride but I don't see any note suggestion that.

## 2022-05-26 NOTE — Telephone Encounter (Signed)
Rx sent 

## 2022-05-26 NOTE — Telephone Encounter (Signed)
Medication:   tamsulosin (FLOMAX) 0.4 MG CAPS capsule [[id number]]   Has the patient contacted their pharmacy? No. (If no, request that the patient contact the pharmacy for the refill.) (If yes, when and what did the pharmacy advise?)  Preferred Pharmacy (with phone number or street name):   [facility] [address]  [address], [place] [address]  Phone:  [phone]  Fax:  [phone]   Agent: Please be advised that RX refills may take up to 3 business days. We ask that you follow-up with your pharmacy.

## 2022-06-06 ENCOUNTER — Other Ambulatory Visit: Payer: Self-pay | Admitting: Hematology & Oncology

## 2022-06-06 DIAGNOSIS — I82431 Acute embolism and thrombosis of right popliteal vein: Secondary | ICD-10-CM

## 2022-06-09 ENCOUNTER — Ambulatory Visit (INDEPENDENT_AMBULATORY_CARE_PROVIDER_SITE_OTHER): Payer: Medicare HMO | Admitting: Internal Medicine

## 2022-06-09 ENCOUNTER — Encounter: Payer: Self-pay | Admitting: Internal Medicine

## 2022-06-09 VITALS — BP 122/80 | HR 64 | Ht 71.0 in | Wt 234.0 lb

## 2022-06-09 DIAGNOSIS — E1165 Type 2 diabetes mellitus with hyperglycemia: Secondary | ICD-10-CM

## 2022-06-09 LAB — BASIC METABOLIC PANEL
BUN: 37 mg/dL — ABNORMAL HIGH (ref 6–23)
CO2: 22 mEq/L (ref 19–32)
Calcium: 9.2 mg/dL (ref 8.4–10.5)
Chloride: 106 mEq/L (ref 96–112)
Creatinine, Ser: 2.45 mg/dL — ABNORMAL HIGH (ref 0.40–1.50)
GFR: 25.95 mL/min — ABNORMAL LOW (ref >60.00)
Glucose, Bld: 187 mg/dL — ABNORMAL HIGH (ref 70–99)
Potassium: 4.9 mEq/L (ref 3.5–5.1)
Sodium: 136 mEq/L (ref 135–145)

## 2022-06-09 LAB — POCT GLYCOSYLATED HEMOGLOBIN (HGB A1C): Hemoglobin A1C: 8.8 % — AB (ref 4.0–5.6)

## 2022-06-09 LAB — POCT GLUCOSE (DEVICE FOR HOME USE): Glucose Fasting, POC: 189 mg/dL — AB (ref 70–99)

## 2022-06-09 MED ORDER — EMPAGLIFLOZIN 10 MG PO TABS: 10.0000 mg | ORAL_TABLET | Freq: Every day | ORAL | 1 refills | Status: DC

## 2022-06-09 MED ORDER — EMPAGLIFLOZIN 25 MG PO TABS: 25.0000 mg | ORAL_TABLET | Freq: Every day | ORAL | 11 refills | Status: DC

## 2022-06-09 NOTE — Progress Notes (Signed)
Name: Craig Baldwin  MRN/ DOB: [id number], 05-21-1951   Age/ Sex: 71 y.o., male    PCP: Elise Benne   Reason for Endocrinology Evaluation: Type 2 Diabetes Mellitus     Date of Initial Endocrinology Visit: 02/04/2022    PATIENT IDENTIFIER: [name] is a 71 y.o. male with a past medical history of T2DM, CKD, dyslipidemia, obesity and OSA, hx of DVT. The patient presented for initial endocrinology clinic visit on 02/04/2022 for consultative assistance with his diabetes management.    HPI: [name] was    Diagnosed with DM years ago  Prior Medications tried/Intolerance: Glimepiride started 12/2021. Actos, Metformin.  Currently checking blood sugars 0 x / day. Does not believe the validity of glucose meter  Hemoglobin A1c has ranged from 6.4% in 2021, peaking at 10.4 %in 2022.   On his initial visit to our clinic his A1c 6.9 % , down from 10.4% . We continued Glimepiride and switched INvokana to Jardiance   SUBJECTIVE:   During the last visit (02/04/2022): A1c 6.9 % , down from 10.4% . We continued Glimepiride and switched INvokana to [place]   Today (06/09/22): [name] is here for a follow up on diabetes management. He  checks his blood sugars 0 times daily    He has not taken Glimepiride because he was unde the impression I told him to stop it but I showed him the last AVS   Denies nausea, vomiting or diarrhea  Denies urine infection   HOME DIABETES REGIMEN: Jardiance 10 mg daily  Glimepiride 1 mg daily     Statin: yes ACE-I/ARB: yes Prior Diabetic Education: Yes, saw Mickel Baas 12/2021   METER DOWNLOAD SUMMARY: did not bring    DIABETIC COMPLICATIONS: Microvascular complications:  CKD, neuropathy Denies: retinopathy Last eye exam: Completed does not recall   Macrovascular complications:   Denies: CAD, PVD, CVA   PAST HISTORY: Past Medical History:  Past Medical History:  Diagnosis Date   Chronic kidney disease    Diabetes mellitus  without complication ([place])    Hyperlipidemia    Hypertension    Past Surgical History: No past surgical history on file.  Social History:  reports that he quit smoking about 25 years ago. His smoking use included cigarettes. He quit smokeless tobacco use about 4 years ago.  His smokeless tobacco use included chew. No history on file for alcohol use and drug use. Family History: No family history on file.   HOME MEDICATIONS: Allergies as of 06/09/2022       Reactions   Amoxicillin Itching        Medication List        Accurate as of June 09, 2022  9:16 AM. If you have any questions, ask your nurse or doctor.          benazepril 10 MG tablet Commonly known as: LOTENSIN Take 1 tablet (10 mg total) by mouth daily.   BERBERINE COMPLEX PO Take 1,500 mg by mouth in the morning and at bedtime.   cholecalciferol 25 MCG (1000 UNIT) tablet Commonly known as: VITAMIN D3 Take 1,000 Units by mouth daily.   CINNAMON PO Take by mouth 2 (two) times daily.   Eliquis 5 MG Tabs tablet Generic drug: apixaban Take 1 tablet by mouth twice daily   empagliflozin 10 MG Tabs tablet Commonly known as: JARDIANCE Take 1 tablet (10 mg total) by mouth daily.   rosuvastatin 20 MG tablet Commonly known as: CRESTOR Take 1 tablet (20 mg total)  by mouth daily.   tamsulosin 0.4 MG Caps capsule Commonly known as: FLOMAX Take 1 capsule (0.4 mg total) by mouth daily after supper.   triamcinolone ointment 0.1 % Commonly known as: KENALOG Apply topically 2 (two) times daily as needed.         ALLERGIES: Allergies  Allergen Reactions   Amoxicillin Itching     REVIEW OF SYSTEMS: A comprehensive ROS was conducted with the patient and is negative except as per HPI    OBJECTIVE:   VITAL SIGNS: BP 122/80 (BP Location: Left Arm, Patient Position: Sitting, Cuff Size: Small)   Pulse 64   Ht '5\' 11"'$  (1.803 m)   Wt 234 lb (106.1 kg)   SpO2 97%   BMI 32.64 kg/m    PHYSICAL EXAM:   General: Pt appears well and is in NAD  Neck: General: Supple without adenopathy or carotid bruits. Thyroid: Thyroid size normal.  No goiter or nodules appreciated.  Lungs: Clear with good BS bilat with no rales, rhonchi, or wheezes  Heart: RRR with normal S1 and S2 and no gallops; no murmurs; no rub  Abdomen: Normoactive bowel sounds, soft, nontender, without masses or organomegaly palpable  Extremities:  Lower extremities - No pretibial edema. No lesions.  Neuro: MS is good with appropriate affect, pt is alert and Ox3    DM foot exam: 02/04/2022  The skin of the feet is intact without sores or ulcerations. The pedal pulses are 2+ on right and 2+ on left. The sensation is decreased to a screening 5.07, 10 gram monofilament bilaterally at the toes    DATA REVIEWED:  Lab Results  Component Value Date   HGBA1C 8.8 (A) 06/09/2022   HGBA1C 6.9 (A) 02/04/2022   HGBA1C 10.4 (H) 10/08/2021   Lab Results  Component Value Date   MICROALBUR 5.6 12/03/2020   LDLCALC 52 02/17/2022   CREATININE 2.37 (H) 04/18/2022   No results found for: "MICRALBCREAT"  Lab Results  Component Value Date   CHOL 93 02/17/2022   HDL 30.50 (L) 02/17/2022   LDLCALC 52 02/17/2022   TRIG 53.0 02/17/2022   CHOLHDL 3 02/17/2022        Latest Reference Range & Units 12/24/21 13:04  Sodium 135 - 145 mmol/L 137  Potassium 3.5 - 5.1 mmol/L 4.9  Chloride 98 - 111 mmol/L 105  CO2 22 - 32 mmol/L 23  Glucose 70 - 99 mg/dL 106 (H)  BUN 8 - 23 mg/dL 38 (H)  Creatinine 0.61 - 1.24 mg/dL 2.26 (H)  Calcium 8.9 - 10.3 mg/dL 9.6  Anion gap 5 - 15  9  Alkaline Phosphatase 38 - 126 U/L 54  Albumin 3.5 - 5.0 g/dL 4.4  AST 15 - 41 U/L 18  ALT 0 - 44 U/L 24  Total Protein 6.5 - 8.1 g/dL 6.9  Total Bilirubin 0.3 - 1.2 mg/dL 0.5  GFR, Est Non African American >60 mL/min 30 (L)   ASSESSMENT / PLAN / RECOMMENDATIONS:   1) Type 2 Diabetes Mellitus, Poorly  controlled, With CKD III and neuropathic  complications -  Most recent A1c of 8.8 %. Goal A1c < 7.0 %.    - His A1c has trended down from 10.4 %  to 6.6% and now its up to 8.8 %  - Pt was under the impression I asked to stop Glimepiride ( we reviewed AVS instructions ) , hence the worsening glycemic control  - Jardiance is costing $100 a month but he is able to pay at  this time  - Will increase Jardiance for now and I have asked him to restart Glimepiride  - I emphasized the importance of taking BOTH medications as it has proven that taking one glycemic agent is not sufficient to control his glucose  -GFR remains stable but low, would not increase Jardiance  MEDICATIONS: Continue Jardiance 10 mg, 1 tablet  daily  Restart  Glimepiride 1 mg, 1 tablet before Breakfast   EDUCATION / INSTRUCTIONS: BG monitoring instructions: Patient does't trust the validity of glucose meter  Call [facility] if: BG persistently < 70 I reviewed the Rule of 15 for the treatment of hypoglycemia in detail with the patient. Literature supplied.   2) Diabetic complications:  Eye: Does not have known diabetic retinopathy.  Neuro/ Feet: Does  have known diabetic peripheral neuropathy. Renal: Patient does  have known baseline CKD. Follows with Dr. Danelle Berry with [facility]     F/U in 4 months    Signed electronically by: Mack Guise, MD  [facility]  [facility] [address] [place], [place] [address] Phone: [phone] FAX: [phone]   CC: Elise Benne [address] [place] [address] Phone: [phone]  Fax: [phone]    Return to Endocrinology clinic as below: Future Appointments  Date Time Provider [place]  10/17/2022  9:30 AM [facility] LAB [facility] None  10/17/2022 10:00 AM [facility]  10/17/2022 10:30 AM Celso Amy, NP [facility] None

## 2022-06-09 NOTE — Patient Instructions (Addendum)
-  Continue Jardiance 10  mg, 1 tablet  daily  - Restart  Glimepiride 1 mg, 1 tablet before Breakfast      HOW TO TREAT LOW BLOOD SUGARS (Blood sugar LESS THAN 70 MG/DL) Please follow the RULE OF 15 for the treatment of hypoglycemia treatment (when your (blood sugars are less than 70 mg/dL)   STEP 1: Take 15 grams of carbohydrates when your blood sugar is low, which includes:  3-4 GLUCOSE TABS  OR 3-4 OZ OF JUICE OR REGULAR SODA OR ONE TUBE OF GLUCOSE GEL    STEP 2: RECHECK blood sugar in 15 MINUTES STEP 3: If your blood sugar is still low at the 15 minute recheck --> then, go back to STEP 1 and treat AGAIN with another 15 grams of carbohydrates.

## 2022-07-26 IMAGING — CT CT CARDIAC CORONARY ARTERY CALCIUM SCORE
3 series · 14 of 20 positions shown, 16 images · non-contrast
Comparison: None.

Addendum:
CLINICAL DATA: Cardiovascular Disease Risk stratification

EXAM:
Coronary Calcium Score
TECHNIQUE: A gated, non-contrast computed tomography scan of the heart was
performed using 3mm slice thickness. Axial images were analyzed on a
dedicated workstation. Calcium scoring of the coronary arteries was
performed using the Agatston method.

[Series 2: cascseq 2.0 sa36 70% (id) · axial · 0.46mm/px · z∈[-262,-172]mm · 4 of 76 slices shown]
[im 16/76  vessel]
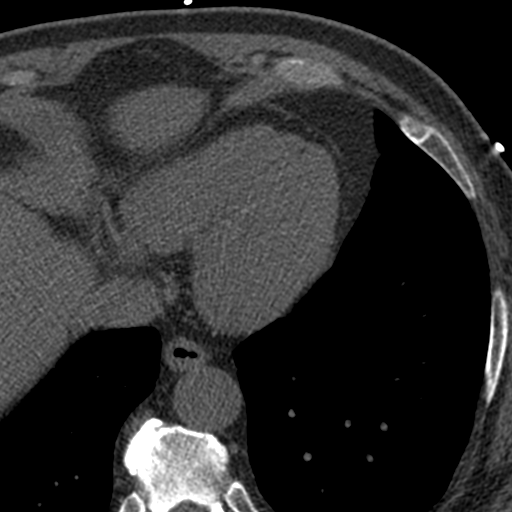
[im 31/76  vessel]
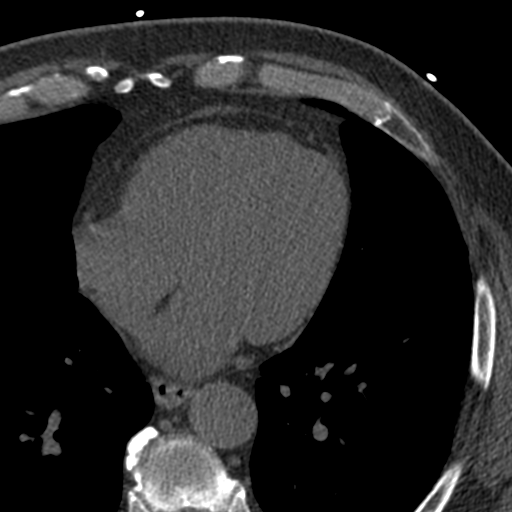
[im 46/76  vessel]
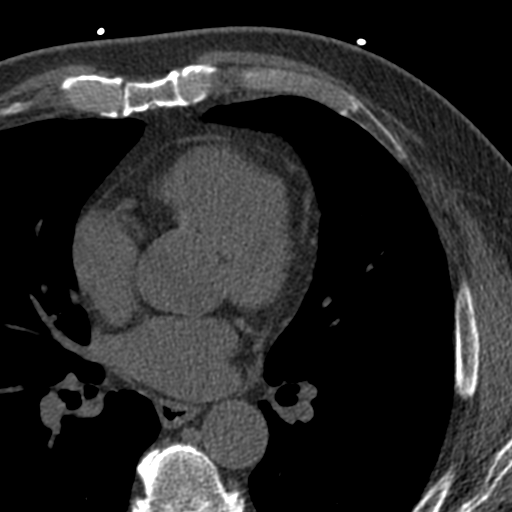
[im 61/76  vessel]
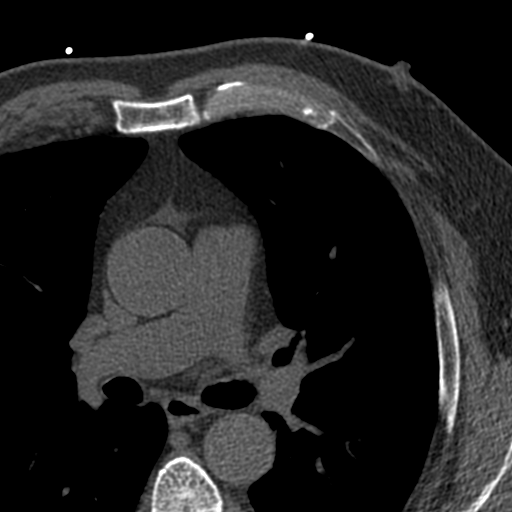

[Series 3: cascseq 2.0 bf37 st · axial · 0.78mm/px · z∈[-268,-168]mm · 5 of 76 slices shown, 7 images]
[im 13/76  vessel]
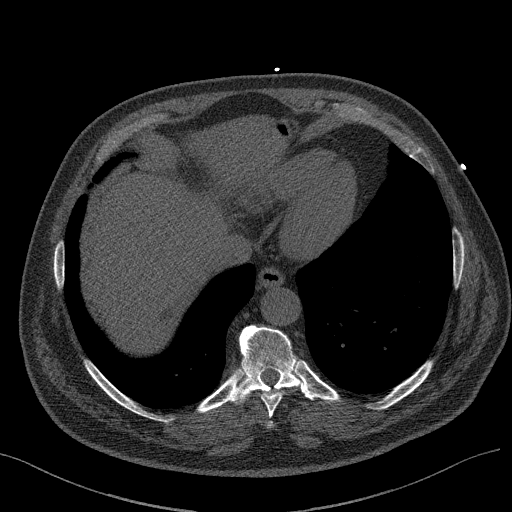
[im 13/76  lung]
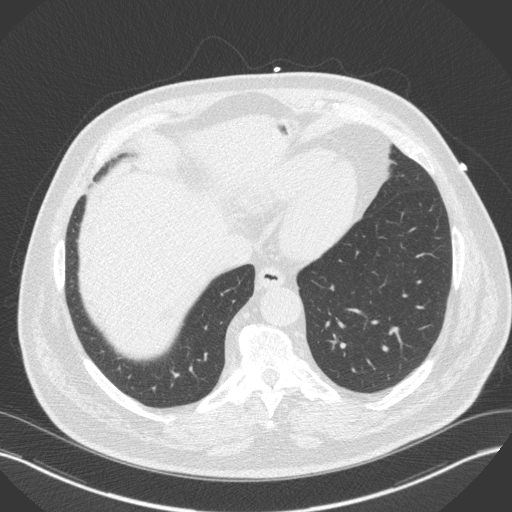
[im 26/76  vessel]
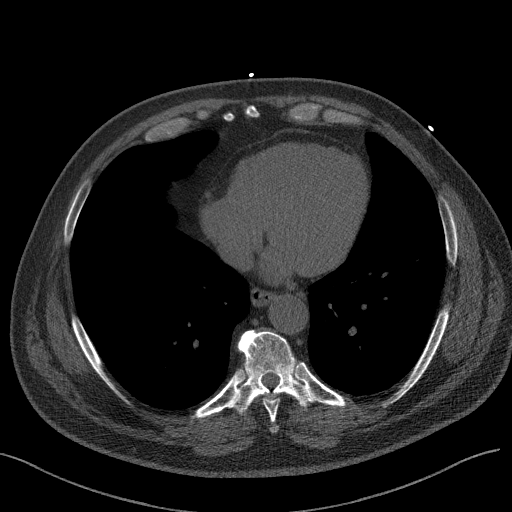
[im 38/76  vessel]
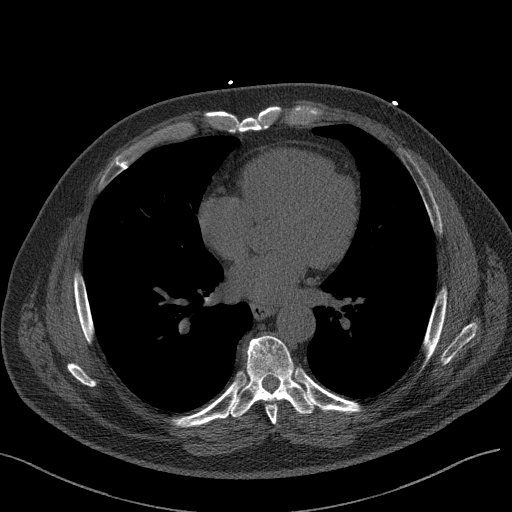
[im 51/76  vessel]
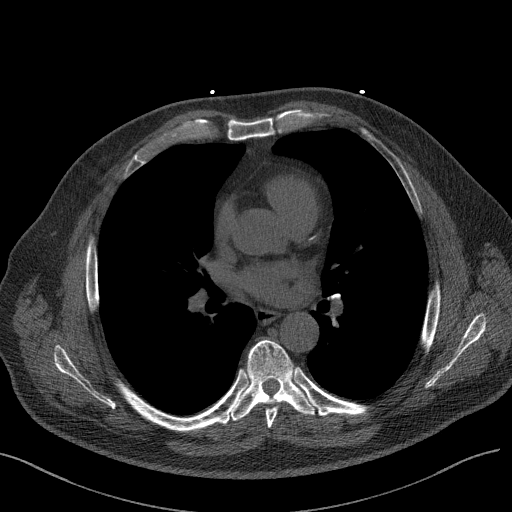
[im 63/76  vessel]
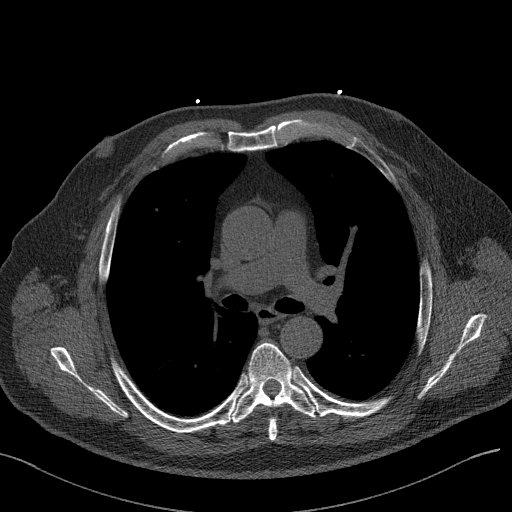
[im 63/76  lung]
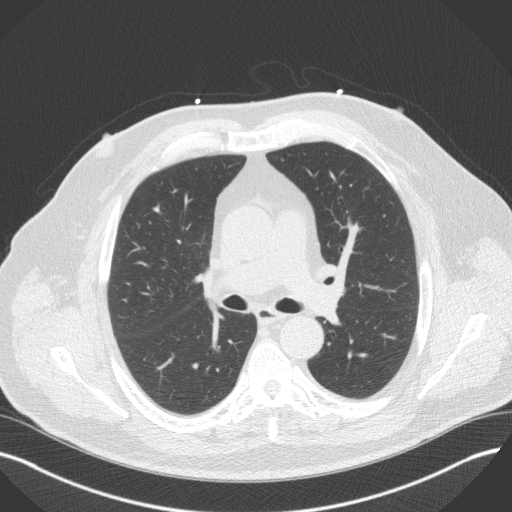

[Series 4: cascseq 2.0 br59 lung · axial · 0.78mm/px · z∈[-268,-168]mm · 5 of 76 slices shown]
[im 13/76  lung]
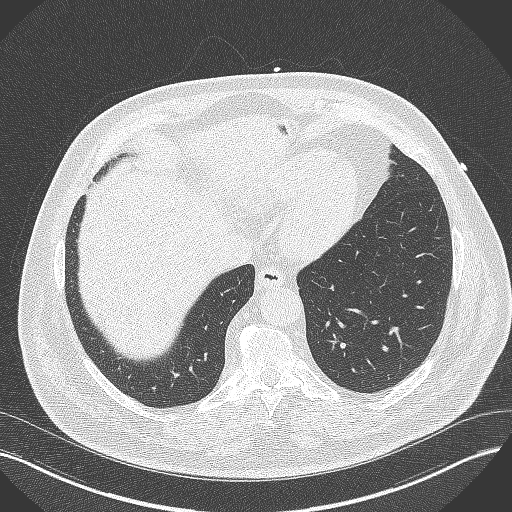
[im 26/76  lung]
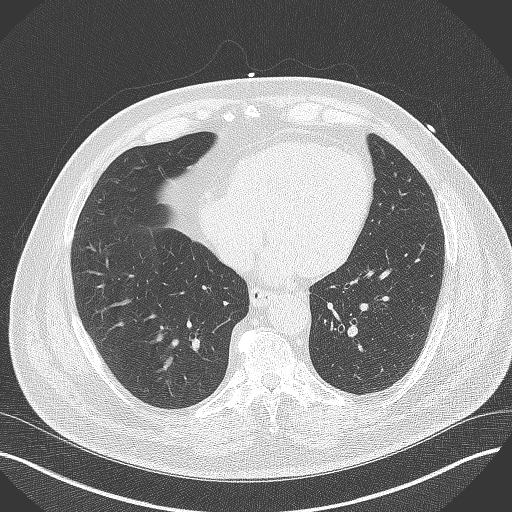
[im 38/76  lung]
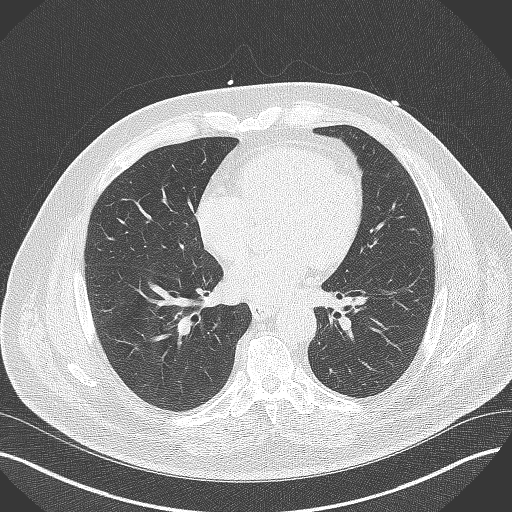
[im 51/76  lung]
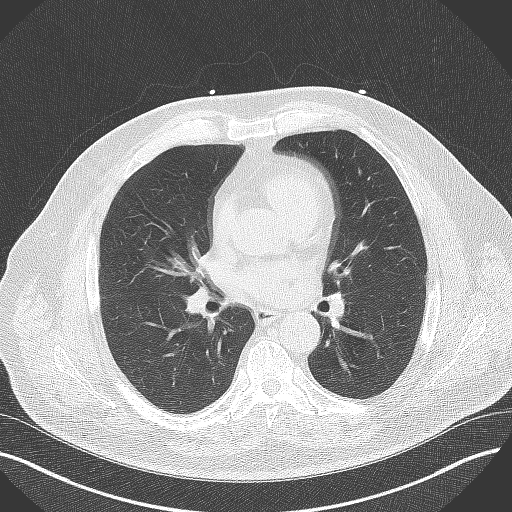
[im 63/76  lung]
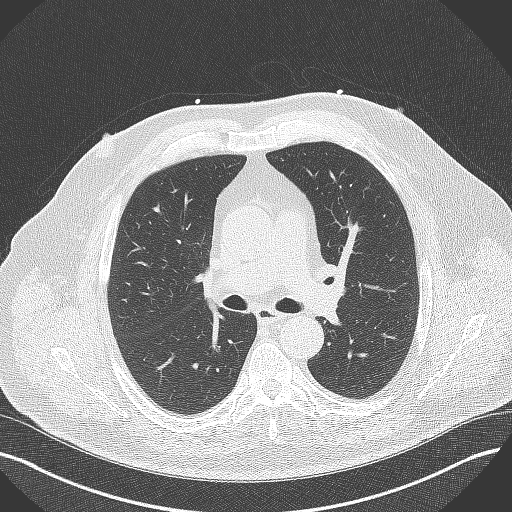

[14 of 20 positions shown; findings below may reference images not displayed]

FINDINGS: Coronary Calcium Score:

Left main: 0

Left anterior descending artery:

Left circumflex artery: 0

Right coronary artery: 0

Total:

Percentile: 27

Pericardium: Normal.

Ascending Aorta: Mildly dilated (42 mm); aortic atherosclerosis.

Non-cardiac: See separate report from [REDACTED].
IMPRESSION: Coronary calcium score of 28.1. This was 27 percentile for age-,
race-, and sex-matched controls.

Mildly dilated aortic root (42 mm).

Aortic atherosclerosis.



If CAC=0, it is reasonable to withhold statin therapy and reassess
in 5 to 10 years, as long as higher risk conditions are absent
(diabetes mellitus, family history of premature CHD in first degree
relatives (males <55 years; females <65 years), cigarette smoking,
or LDL >=190 mg/dL).

If CAC is 1 to 99, it is reasonable to initiate statin therapy for
patients >=55 years of age.

If CAC is >=100 or >=75th percentile, it is reasonable to initiate
statin therapy at any age.

Cardiology referral should be considered for patients with CAC
scores >=400 or >=75th percentile.

*7286 AHA/ACC/AACVPR/AAPA/ABC/HAUGEN/NBILI/EDRIJA/Encontros/SHEN/GEIR ARNE/GAYUS
Guideline on the Management of Blood Cholesterol: A Report of the
American College of Cardiology/American Heart Association Task Force
on Clinical Practice Guidelines. J Am Coll Cardiol.
2780;73(24):5515-5197.

EXAM:
OVER-READ INTERPRETATION  CT CHEST

The following report is an over-read performed by radiologist Dr.
does not include interpretation of cardiac or coronary anatomy or
pathology. The coronary calcium score interpretation by the
cardiologist is attached.
FINDINGS: 1.3 cm hypodensity in the right hepatic dome likely represents a
hepatic cyst. Small lymph nodes in the mediastinum that are not
pathologically enlarged. Small amount of calcium in the left hilum.
Findings are suggestive for old granulomatous disease. Visualized
lungs are clear without airspace disease, consolidation or pleural
effusions. No acute bone abnormality.
IMPRESSION: No acute extracardiac findings.

*** End of Addendum ***
FINDINGS: Coronary Calcium Score:

Left main: 0

Left anterior descending artery:

Left circumflex artery: 0

Right coronary artery: 0

Total:

Percentile: 27

Pericardium: Normal.

Ascending Aorta: Mildly dilated (42 mm); aortic atherosclerosis.

Non-cardiac: See separate report from [REDACTED].
IMPRESSION: Coronary calcium score of 28.1. This was 27 percentile for age-,
race-, and sex-matched controls.

Mildly dilated aortic root (42 mm).

Aortic atherosclerosis.



If CAC=0, it is reasonable to withhold statin therapy and reassess
in 5 to 10 years, as long as higher risk conditions are absent
(diabetes mellitus, family history of premature CHD in first degree
relatives (males <55 years; females <65 years), cigarette smoking,
or LDL >=190 mg/dL).

If CAC is 1 to 99, it is reasonable to initiate statin therapy for
patients >=55 years of age.

If CAC is >=100 or >=75th percentile, it is reasonable to initiate
statin therapy at any age.

Cardiology referral should be considered for patients with CAC
scores >=400 or >=75th percentile.

*7286 AHA/ACC/AACVPR/AAPA/ABC/HAUGEN/NBILI/EDRIJA/Encontros/SHEN/GEIR ARNE/GAYUS
Guideline on the Management of Blood Cholesterol: A Report of the
American College of Cardiology/American Heart Association Task Force
on Clinical Practice Guidelines. J Am Coll Cardiol.
2780;73(24):5515-5197.

## 2022-08-13 ENCOUNTER — Other Ambulatory Visit: Payer: Self-pay | Admitting: Medical

## 2022-09-26 ENCOUNTER — Other Ambulatory Visit: Payer: Self-pay | Admitting: Family

## 2022-10-04 ENCOUNTER — Other Ambulatory Visit: Payer: Self-pay | Admitting: Hematology & Oncology

## 2022-10-04 DIAGNOSIS — I82431 Acute embolism and thrombosis of right popliteal vein: Secondary | ICD-10-CM

## 2022-10-09 ENCOUNTER — Ambulatory Visit: Payer: Medicare HMO | Admitting: Internal Medicine

## 2022-10-16 DIAGNOSIS — M47814 Spondylosis without myelopathy or radiculopathy, thoracic region: Secondary | ICD-10-CM | POA: Diagnosis not present

## 2022-10-16 DIAGNOSIS — M4804 Spinal stenosis, thoracic region: Secondary | ICD-10-CM | POA: Diagnosis not present

## 2022-10-16 DIAGNOSIS — N200 Calculus of kidney: Secondary | ICD-10-CM | POA: Diagnosis not present

## 2022-10-16 DIAGNOSIS — M40204 Unspecified kyphosis, thoracic region: Secondary | ICD-10-CM | POA: Diagnosis not present

## 2022-10-16 DIAGNOSIS — R109 Unspecified abdominal pain: Secondary | ICD-10-CM | POA: Diagnosis not present

## 2022-10-16 DIAGNOSIS — M546 Pain in thoracic spine: Secondary | ICD-10-CM | POA: Diagnosis not present

## 2022-10-17 ENCOUNTER — Inpatient Hospital Stay (HOSPITAL_BASED_OUTPATIENT_CLINIC_OR_DEPARTMENT_OTHER): Payer: Medicare HMO | Admitting: Family

## 2022-10-17 ENCOUNTER — Inpatient Hospital Stay: Payer: Medicare HMO | Attending: Hematology & Oncology

## 2022-10-17 ENCOUNTER — Ambulatory Visit (HOSPITAL_BASED_OUTPATIENT_CLINIC_OR_DEPARTMENT_OTHER)
Admission: RE | Admit: 2022-10-17 | Discharge: 2022-10-17 | Disposition: A | Discharge status: Home / Self Care | Payer: Medicare HMO | Source: Ambulatory Visit | Attending: Family | Admitting: Family

## 2022-10-17 DIAGNOSIS — I82431 Acute embolism and thrombosis of right popliteal vein: Secondary | ICD-10-CM

## 2022-10-17 DIAGNOSIS — Z87891 Personal history of nicotine dependence: Secondary | ICD-10-CM | POA: Insufficient documentation

## 2022-10-17 DIAGNOSIS — D6851 Activated protein C resistance: Secondary | ICD-10-CM | POA: Diagnosis not present

## 2022-10-17 DIAGNOSIS — Z7984 Long term (current) use of oral hypoglycemic drugs: Secondary | ICD-10-CM | POA: Insufficient documentation

## 2022-10-17 DIAGNOSIS — E119 Type 2 diabetes mellitus without complications: Secondary | ICD-10-CM | POA: Diagnosis not present

## 2022-10-17 DIAGNOSIS — D6859 Other primary thrombophilia: Secondary | ICD-10-CM

## 2022-10-17 DIAGNOSIS — Z801 Family history of malignant neoplasm of trachea, bronchus and lung: Secondary | ICD-10-CM | POA: Diagnosis not present

## 2022-10-17 DIAGNOSIS — Z7901 Long term (current) use of anticoagulants: Secondary | ICD-10-CM | POA: Diagnosis not present

## 2022-10-17 LAB — CBC WITH DIFFERENTIAL (CANCER CENTER ONLY)
Abs Immature Granulocytes: 0.03 10*3/uL (ref 0.00–0.07)
Basophils Absolute: 0 10*3/uL (ref 0.0–0.1)
Basophils Relative: 0 %
Eosinophils Absolute: 0.2 10*3/uL (ref 0.0–0.5)
Eosinophils Relative: 3 %
HCT: 40.5 % (ref 39.0–52.0)
Hemoglobin: 13.3 g/dL (ref 13.0–17.0)
Immature Granulocytes: 0 %
Lymphocytes Relative: 25 %
Lymphs Abs: 1.8 10*3/uL (ref 0.7–4.0)
MCH: 29.9 pg (ref 26.0–34.0)
MCHC: 32.8 g/dL (ref 30.0–36.0)
MCV: 91 fL (ref 80.0–100.0)
Monocytes Absolute: 0.5 10*3/uL (ref 0.1–1.0)
Monocytes Relative: 8 %
Neutro Abs: 4.4 10*3/uL (ref 1.7–7.7)
Neutrophils Relative %: 64 %
Platelet Count: 175 10*3/uL (ref 150–400)
RBC: 4.45 MIL/uL (ref 4.22–5.81)
RDW: 13.2 % (ref 11.5–15.5)
WBC Count: 7 10*3/uL (ref 4.0–10.5)
nRBC: 0 % (ref 0.0–0.2)

## 2022-10-17 LAB — CMP (CANCER CENTER ONLY)
ALT: 13 U/L (ref 0–44)
AST: 12 U/L — ABNORMAL LOW (ref 15–41)
Albumin: 4.2 g/dL (ref 3.5–5.0)
Alkaline Phosphatase: 49 U/L (ref 38–126)
Anion gap: 7 (ref 5–15)
BUN: 37 mg/dL — ABNORMAL HIGH (ref 8–23)
CO2: 24 mmol/L (ref 22–32)
Calcium: 9 mg/dL (ref 8.9–10.3)
Chloride: 105 mmol/L (ref 98–111)
Creatinine: 2.67 mg/dL — ABNORMAL HIGH (ref 0.61–1.24)
GFR, Estimated: 25 mL/min — ABNORMAL LOW (ref >60)
Glucose, Bld: 189 mg/dL — ABNORMAL HIGH (ref 70–99)
Potassium: 5 mmol/L (ref 3.5–5.1)
Sodium: 136 mmol/L (ref 135–145)
Total Bilirubin: 0.4 mg/dL (ref 0.3–1.2)
Total Protein: 6.6 g/dL (ref 6.5–8.1)

## 2022-10-17 LAB — LACTATE DEHYDROGENASE: LDH: 118 U/L (ref 98–192)

## 2022-10-17 LAB — D-DIMER, QUANTITATIVE: D-Dimer, Quant: 0.42 ug/mL-FEU (ref 0.00–0.50)

## 2022-10-17 MED ORDER — APIXABAN 2.5 MG PO TABS: 2.5000 mg | ORAL_TABLET | Freq: Two times a day (BID) | ORAL | 5 refills | Status: DC

## 2022-10-17 NOTE — Progress Notes (Signed)
Hematology and Oncology Follow Up Visit  Craig Baldwin [id number] 01-Nov-1951 71 y.o. 10/17/2022   Principle Diagnosis:  Acute DVT of popliteal vein of right lower extremity Heterozygous for Factor V Leiden    Current Therapy:        Eliquis 5 mg PO BID   Interim History:  Craig Baldwin is here today for follow-up. He is doing well and has no complaints at this time outside of generalized arthritis aches and pains.  He is doing well on Eliquis 5 mg PO BID.  Repeat US today showed "stable appearance of echogenic recanalized thrombus in the right popliteal vein consistent with chronic deep venous thrombosis". No acute DVT noted.  No blood loss noted. No bruising or petechaie.  No fever, chills, n/v, cough, rash, dizziness, SOB, chest pain, palpitations, abdominal pain or changes in bowel or bladder habits.  No swelling, tenderness, numbness or tingling in his extremities at this time.  No falls or syncope reported.  Appetite and hydration are good. Weight is stable at    ECOG Performance Status: 1 - Symptomatic but completely ambulatory  Medications:  Allergies as of 10/17/2022       Reactions   Amoxicillin Itching        Medication List        Accurate as of October 17, 2022 10:55 AM. If you have any questions, ask your nurse or doctor.          STOP taking these medications    BERBERINE COMPLEX PO Stopped by: Lottie Dawson, NP   CINNAMON PO Stopped by: Lottie Dawson, NP   rosuvastatin 20 MG tablet Commonly known as: CRESTOR Stopped by: Lottie Dawson, NP       TAKE these medications    benazepril 10 MG tablet Commonly known as: LOTENSIN Take 1 tablet by mouth once daily   cholecalciferol 25 MCG (1000 UNIT) tablet Commonly known as: VITAMIN D3 Take 1,000 Units by mouth daily.   Eliquis 5 MG Tabs tablet Generic drug: apixaban Take 1 tablet by mouth twice daily   empagliflozin 10 MG Tabs tablet Commonly known as: Jardiance Take 1 tablet (10 mg  total) by mouth daily before breakfast.   glimepiride 1 MG tablet Commonly known as: AMARYL Take 1 mg by mouth every morning.   tamsulosin 0.4 MG Caps capsule Commonly known as: FLOMAX Take 1 capsule (0.4 mg total) by mouth daily after supper.   triamcinolone ointment 0.1 % Commonly known as: KENALOG Apply topically 2 (two) times daily as needed.        Allergies:  Allergies  Allergen Reactions   Amoxicillin Itching    Past Medical History, Surgical history, Social history, and Family History were reviewed and updated.  Review of Systems: All other 10 point review of systems is negative.   Physical Exam:  height is '5\' 11"'$  (1.803 m) and weight is 237 lb (107.5 kg). His oral temperature is 98 F (36.7 C). His blood pressure is 100/70 and his pulse is 58 (abnormal). His respiration is 16 and oxygen saturation is 97%.   Wt Readings from Last 3 Encounters:  10/17/22 237 lb (107.5 kg)  06/09/22 234 lb (106.1 kg)  04/18/22 235 lb 12.8 oz (107 kg)    Ocular: Sclerae unicteric, pupils equal, round and reactive to light Ear-nose-throat: Oropharynx clear, dentition fair Lymphatic: No cervical or supraclavicular adenopathy Lungs no rales or rhonchi, good excursion bilaterally Heart regular rate and rhythm, no murmur appreciated Abd soft, nontender, positive bowel sounds MSK no  focal spinal tenderness, no joint edema Neuro: non-focal, well-oriented, appropriate affect Breasts:   Lab Results  Component Value Date   WBC 7.0 10/17/2022   HGB 13.3 10/17/2022   HCT 40.5 10/17/2022   MCV 91.0 10/17/2022   PLT 175 10/17/2022   No results found for: "FERRITIN", "IRON", "TIBC", "UIBC", "IRONPCTSAT" Lab Results  Component Value Date   RBC 4.45 10/17/2022   No results found for: "KPAFRELGTCHN", "LAMBDASER", "KAPLAMBRATIO" No results found for: "IGGSERUM", "IGA", "IGMSERUM" No results found for: "TOTALPROTELP", "ALBUMINELP", "A1GS", "A2GS", "BETS", "BETA2SER", "GAMS", "MSPIKE",  "SPEI"   Chemistry      Component Value Date/Time   NA 136 10/17/2022 0951   NA 135 10/08/2021 0852   K 5.0 10/17/2022 0951   CL 105 10/17/2022 0951   CO2 24 10/17/2022 0951   BUN 37 (H) 10/17/2022 0951   BUN 23 10/08/2021 0852   CREATININE 2.67 (H) 10/17/2022 0951   CREATININE 2.39 (H) 06/01/2020 1544      Component Value Date/Time   CALCIUM 9.0 10/17/2022 0951   ALKPHOS 49 10/17/2022 0951   AST 12 (L) 10/17/2022 0951   ALT 13 10/17/2022 0951   BILITOT 0.4 10/17/2022 0951       Impression and Plan: Craig Baldwin is a very pleasant 71 yo caucasian gentleman with diagnosis of acute DVT in the right popliteal vein in September 2022. 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  He will complete his current script for Eliquis 5 mg PO BID and then transition to 2.5 mg PO BID.  Follow-up in 6 months.   Lottie Dawson, NP 10/27/202310:55 AM

## 2022-10-23 ENCOUNTER — Telehealth: Payer: Self-pay | Admitting: *Deleted

## 2022-10-23 NOTE — Telephone Encounter (Signed)
Per 10/17/22 los - called patient and gave upcoming appointments - confirmed

## 2022-11-12 ENCOUNTER — Other Ambulatory Visit: Payer: Self-pay

## 2022-11-12 DIAGNOSIS — E1165 Type 2 diabetes mellitus with hyperglycemia: Secondary | ICD-10-CM

## 2022-11-12 MED ORDER — EMPAGLIFLOZIN 10 MG PO TABS: 10.0000 mg | ORAL_TABLET | Freq: Every day | ORAL | 1 refills | Status: DC

## 2022-11-17 ENCOUNTER — Other Ambulatory Visit: Payer: Self-pay | Admitting: Medical

## 2023-01-31 DIAGNOSIS — E1165 Type 2 diabetes mellitus with hyperglycemia: Secondary | ICD-10-CM | POA: Diagnosis not present

## 2023-01-31 DIAGNOSIS — Z87891 Personal history of nicotine dependence: Secondary | ICD-10-CM | POA: Diagnosis not present

## 2023-01-31 DIAGNOSIS — M4800 Spinal stenosis, site unspecified: Secondary | ICD-10-CM | POA: Diagnosis not present

## 2023-01-31 DIAGNOSIS — N400 Enlarged prostate without lower urinary tract symptoms: Secondary | ICD-10-CM | POA: Diagnosis not present

## 2023-01-31 DIAGNOSIS — G4733 Obstructive sleep apnea (adult) (pediatric): Secondary | ICD-10-CM | POA: Diagnosis not present

## 2023-01-31 DIAGNOSIS — E1122 Type 2 diabetes mellitus with diabetic chronic kidney disease: Secondary | ICD-10-CM | POA: Diagnosis not present

## 2023-01-31 DIAGNOSIS — Z8249 Family history of ischemic heart disease and other diseases of the circulatory system: Secondary | ICD-10-CM | POA: Diagnosis not present

## 2023-01-31 DIAGNOSIS — N189 Chronic kidney disease, unspecified: Secondary | ICD-10-CM | POA: Diagnosis not present

## 2023-01-31 DIAGNOSIS — E1140 Type 2 diabetes mellitus with diabetic neuropathy, unspecified: Secondary | ICD-10-CM | POA: Diagnosis not present

## 2023-01-31 DIAGNOSIS — N529 Male erectile dysfunction, unspecified: Secondary | ICD-10-CM | POA: Diagnosis not present

## 2023-01-31 DIAGNOSIS — E785 Hyperlipidemia, unspecified: Secondary | ICD-10-CM | POA: Diagnosis not present

## 2023-01-31 DIAGNOSIS — Z008 Encounter for other general examination: Secondary | ICD-10-CM | POA: Diagnosis not present

## 2023-01-31 DIAGNOSIS — I129 Hypertensive chronic kidney disease with stage 1 through stage 4 chronic kidney disease, or unspecified chronic kidney disease: Secondary | ICD-10-CM | POA: Diagnosis not present

## 2023-02-02 ENCOUNTER — Other Ambulatory Visit: Payer: Self-pay | Admitting: Cardiology

## 2023-02-09 DIAGNOSIS — E119 Type 2 diabetes mellitus without complications: Secondary | ICD-10-CM | POA: Diagnosis not present

## 2023-02-09 DIAGNOSIS — H25813 Combined forms of age-related cataract, bilateral: Secondary | ICD-10-CM | POA: Diagnosis not present

## 2023-02-12 ENCOUNTER — Other Ambulatory Visit: Payer: Self-pay | Admitting: Medical

## 2023-02-12 ENCOUNTER — Ambulatory Visit: Payer: Medicare HMO | Admitting: Internal Medicine

## 2023-02-14 ENCOUNTER — Other Ambulatory Visit: Payer: Self-pay | Admitting: Medical

## 2023-02-24 ENCOUNTER — Other Ambulatory Visit: Payer: Self-pay | Admitting: Internal Medicine

## 2023-03-14 ENCOUNTER — Other Ambulatory Visit: Payer: Self-pay | Admitting: Medical

## 2023-03-20 DIAGNOSIS — R300 Dysuria: Secondary | ICD-10-CM | POA: Diagnosis not present

## 2023-03-20 DIAGNOSIS — Z87891 Personal history of nicotine dependence: Secondary | ICD-10-CM | POA: Diagnosis not present

## 2023-03-20 DIAGNOSIS — R319 Hematuria, unspecified: Secondary | ICD-10-CM | POA: Diagnosis not present

## 2023-03-23 ENCOUNTER — Telehealth: Payer: Self-pay

## 2023-03-23 NOTE — Telephone Encounter (Signed)
Called pt and stated he is doing better and went to ED over the weekend

## 2023-03-23 NOTE — Telephone Encounter (Signed)
Initial Comment Caller states he has a urinary infections. It is pretty bad and it burns. Translation No Nurse Assessment Nurse: Cox, RN, Holly Date/Time (Eastern Time): 03/20/2023 12:35:17 PM Confirm and document reason for call. If symptomatic, describe symptoms. ---Caller states he has been having burning with urination for the past two days with no fever. States that he started taking AZO yesterday and it does not seem to be helping. Does the patient have any new or worsening symptoms? ---Yes Will a triage be completed? ---Yes Related visit to physician within the last 2 weeks? ---No Does the PT have any chronic conditions? (i.e. diabetes, asthma, this includes High risk factors for pregnancy, etc.) ---Yes List chronic conditions. ---DM, HTN, High cholesterol Is this a behavioral health or substance abuse call? ---No Guidelines Guideline Title Affirmed Question Affirmed Notes Nurse Date/Time (Eastern Time) Urination Pain - Male Diabetes mellitus or weak immune system (e.g., HIV positive, cancer chemo, splenectomy, organ transplant, chronic steroids) Cox, RN, [facility] 03/20/2023 12:36:40 PM PLEASE NOTE: All timestamps contained within this report are represented as [place] Standard Time. CONFIDENTIALTY NOTICE: This fax transmission is intended only for the addressee. It contains information that is legally privileged, confidential or otherwise protected from use or disclosure. If you are not the intended recipient, you are strictly prohibited from reviewing, disclosing, copying using or disseminating any of this information or taking any action in reliance on or regarding this information. If you have received this fax in error, please notify us immediately by telephone so that we can arrange for its return to [place]. Phone: [phone], Toll-Free: [phone], Fax: [phone] Page: 2 of 2 Call Id: [id number] [place]. Time Eilene Ghazi Time) Disposition Final User 03/20/2023 12:41:34 PM  See HCP within 4 Hours (or PCP triage) Yes Cox, RN, [facility] Final Disposition 03/20/2023 12:41:34 PM See HCP within 4 Hours (or PCP triage) Yes Cox, RN, [facility] Disagree/Comply Personal assistant Understands Yes PreDisposition Call Doctor Care Advice Given Per Guideline * IF OFFICE WILL BE CLOSED AND NO PCP (PRIMARY CARE PROVIDER) SECOND-LEVEL TRIAGE: You need to be seen within the next 3 or 4 hours. A nearby Ur[facility] [place] [place]) is often a good source of care. Another choice is to go to the ED. Go sooner if you become worse. * ED: Patients who may need surgery or hospital admission need to be sent to an ED. So do most patients with serious symptoms or complex medical problems. * UCC: Some UCCs can manage patients who are stable and have less serious symptoms (e.g., minor illnesses and injuries). The triager must know the [facility] capabilities before sending a patient there. If unsure, call ahead. CALL BACK IF: * You become worse CARE ADVICE given per Urination Pain - Male (Adult) guideline. Referrals [facility] - ED

## 2023-04-02 ENCOUNTER — Other Ambulatory Visit: Payer: Self-pay | Admitting: Hematology & Oncology

## 2023-04-02 DIAGNOSIS — I82431 Acute embolism and thrombosis of right popliteal vein: Secondary | ICD-10-CM

## 2023-04-06 ENCOUNTER — Encounter: Payer: Self-pay | Admitting: *Deleted

## 2023-04-15 ENCOUNTER — Encounter: Payer: Self-pay | Admitting: Medical

## 2023-04-15 ENCOUNTER — Encounter: Payer: Self-pay | Admitting: Family

## 2023-04-15 ENCOUNTER — Ambulatory Visit (INDEPENDENT_AMBULATORY_CARE_PROVIDER_SITE_OTHER): Payer: Medicare HMO | Admitting: Medical

## 2023-04-15 ENCOUNTER — Other Ambulatory Visit: Payer: Self-pay

## 2023-04-15 ENCOUNTER — Inpatient Hospital Stay: Payer: Medicare HMO | Admitting: Family

## 2023-04-15 ENCOUNTER — Inpatient Hospital Stay: Payer: Medicare HMO | Attending: Hematology & Oncology

## 2023-04-15 VITALS — BP 130/70 | HR 59 | Resp 18 | Ht 71.0 in | Wt 239.0 lb

## 2023-04-15 VITALS — BP 126/64 | HR 53 | Temp 97.7°F | Resp 16 | Ht 71.0 in | Wt 238.0 lb

## 2023-04-15 DIAGNOSIS — Z7984 Long term (current) use of oral hypoglycemic drugs: Secondary | ICD-10-CM

## 2023-04-15 DIAGNOSIS — Z87891 Personal history of nicotine dependence: Secondary | ICD-10-CM | POA: Insufficient documentation

## 2023-04-15 DIAGNOSIS — E1169 Type 2 diabetes mellitus with other specified complication: Secondary | ICD-10-CM

## 2023-04-15 DIAGNOSIS — I82431 Acute embolism and thrombosis of right popliteal vein: Secondary | ICD-10-CM

## 2023-04-15 DIAGNOSIS — D6851 Activated protein C resistance: Secondary | ICD-10-CM

## 2023-04-15 DIAGNOSIS — Z7901 Long term (current) use of anticoagulants: Secondary | ICD-10-CM | POA: Insufficient documentation

## 2023-04-15 DIAGNOSIS — E1165 Type 2 diabetes mellitus with hyperglycemia: Secondary | ICD-10-CM

## 2023-04-15 DIAGNOSIS — Z801 Family history of malignant neoplasm of trachea, bronchus and lung: Secondary | ICD-10-CM | POA: Diagnosis not present

## 2023-04-15 DIAGNOSIS — N1831 Chronic kidney disease, stage 3a: Secondary | ICD-10-CM | POA: Diagnosis not present

## 2023-04-15 DIAGNOSIS — G629 Polyneuropathy, unspecified: Secondary | ICD-10-CM | POA: Insufficient documentation

## 2023-04-15 DIAGNOSIS — I10 Essential (primary) hypertension: Secondary | ICD-10-CM

## 2023-04-15 DIAGNOSIS — E785 Hyperlipidemia, unspecified: Secondary | ICD-10-CM | POA: Diagnosis not present

## 2023-04-15 LAB — CBC WITH DIFFERENTIAL (CANCER CENTER ONLY)
Abs Immature Granulocytes: 0.04 10*3/uL (ref 0.00–0.07)
Basophils Absolute: 0 10*3/uL (ref 0.0–0.1)
Basophils Relative: 0 %
Eosinophils Absolute: 0.2 10*3/uL (ref 0.0–0.5)
Eosinophils Relative: 3 %
HCT: 39.5 % (ref 39.0–52.0)
Hemoglobin: 13.2 g/dL (ref 13.0–17.0)
Immature Granulocytes: 1 %
Lymphocytes Relative: 25 %
Lymphs Abs: 1.8 10*3/uL (ref 0.7–4.0)
MCH: 29.8 pg (ref 26.0–34.0)
MCHC: 33.4 g/dL (ref 30.0–36.0)
MCV: 89.2 fL (ref 80.0–100.0)
Monocytes Absolute: 0.5 10*3/uL (ref 0.1–1.0)
Monocytes Relative: 8 %
Neutro Abs: 4.6 10*3/uL (ref 1.7–7.7)
Neutrophils Relative %: 63 %
Platelet Count: 187 10*3/uL (ref 150–400)
RBC: 4.43 MIL/uL (ref 4.22–5.81)
RDW: 13.6 % (ref 11.5–15.5)
WBC Count: 7.2 10*3/uL (ref 4.0–10.5)
nRBC: 0 % (ref 0.0–0.2)

## 2023-04-15 LAB — CMP (CANCER CENTER ONLY)
ALT: 14 U/L (ref 0–44)
AST: 12 U/L — ABNORMAL LOW (ref 15–41)
Albumin: 4.2 g/dL (ref 3.5–5.0)
Alkaline Phosphatase: 51 U/L (ref 38–126)
Anion gap: 4 — ABNORMAL LOW (ref 5–15)
BUN: 39 mg/dL — ABNORMAL HIGH (ref 8–23)
CO2: 23 mmol/L (ref 22–32)
Calcium: 9.3 mg/dL (ref 8.9–10.3)
Chloride: 107 mmol/L (ref 98–111)
Creatinine: 2.57 mg/dL — ABNORMAL HIGH (ref 0.61–1.24)
GFR, Estimated: 26 mL/min — ABNORMAL LOW (ref >60)
Glucose, Bld: 154 mg/dL — ABNORMAL HIGH (ref 70–99)
Potassium: 5 mmol/L (ref 3.5–5.1)
Sodium: 134 mmol/L — ABNORMAL LOW (ref 135–145)
Total Bilirubin: 0.4 mg/dL (ref 0.3–1.2)
Total Protein: 6.8 g/dL (ref 6.5–8.1)

## 2023-04-15 LAB — LACTATE DEHYDROGENASE: LDH: 117 U/L (ref 98–192)

## 2023-04-15 LAB — D-DIMER, QUANTITATIVE: D-Dimer, Quant: 0.54 ug/mL-FEU — ABNORMAL HIGH (ref 0.00–0.50)

## 2023-04-15 MED ORDER — BENAZEPRIL HCL 10 MG PO TABS: ORAL_TABLET | ORAL | 3 refills | Status: DC

## 2023-04-15 NOTE — Progress Notes (Signed)
Hematology and Oncology Follow Up Visit  Craig Baldwin [id number] [birth year] 72 y.o. 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 He is doing well and tolerating Eliquis nicely. No issues with blood loss or petechiae. He does bruise easily on his arms.  No issues with infection. No fever, chills, n/v, cough, rash, SOB, chest pain, palpitations, abdominal pain or changes in bowel or bladder habits.  He notes occasional dizziness after bending forward and standing back up. No falls or syncope.  No swelling in his extremities.  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  Appetite and hydration are good. Weight is stable at 238 lbs.   ECOG Performance Status: 0 - Asymptomatic  Medications:  Allergies as of 04/15/2023       Reactions   Amoxicillin Itching        Medication List        Accurate as of April 15, 2023  8:47 AM. If you have any questions, ask your nurse or doctor.          apixaban 2.5 MG Tabs tablet Commonly known as: Eliquis Take 1 tablet (2.5 mg total) by mouth 2 (two) times daily.   Eliquis 5 MG Tabs tablet Generic drug: apixaban Take 1 tablet by mouth twice daily   benazepril 10 MG tablet Commonly known as: LOTENSIN TAKE 1 TABLET BY MOUTH ONCE DAILY . APPOINTMENT REQUIRED FOR FUTURE REFILLS   cholecalciferol 25 MCG (1000 UNIT) tablet Commonly known as: VITAMIN D3 Take 1,000 Units by mouth daily.   empagliflozin 10 MG Tabs tablet Commonly known as: Jardiance Take 1 tablet (10 mg total) by mouth daily before breakfast.   glimepiride 1 MG tablet Commonly known as: AMARYL Take 1 tablet by mouth once daily with breakfast   rosuvastatin 20 MG tablet Commonly known as: CRESTOR Take 1 tablet (20 mg total) by mouth daily. Pt needs an office visit  for further refills   tamsulosin 0.4 MG Caps capsule Commonly known as: FLOMAX TAKE 1 CAPSULE BY MOUTH ONCE DAILY AFTER SUPPER   triamcinolone ointment 0.1 % Commonly known as: KENALOG Apply topically 2 (two) times daily as needed.        Allergies:  Allergies  Allergen Reactions   Amoxicillin Itching    Past Medical History, Surgical history, Social history, and Family History were reviewed and updated.  Review of Systems: All other 10 point review of systems is negative.   Physical Exam:  vitals were not taken for this visit.   Wt Readings from Last 3 Encounters:  10/17/22 237 lb (107.5 kg)  06/09/22 234 lb (106.1 kg)  04/18/22 235 lb 12.8 oz (107 kg)    Ocular: Sclerae unicteric, pupils equal, round and reactive to light Ear-nose-throat: Oropharynx clear, dentition fair Lymphatic: No cervical or supraclavicular adenopathy Lungs no rales or rhonchi, good excursion bilaterally Heart regular rate and rhythm, no murmur appreciated Abd soft, nontender, positive bowel sounds MSK no focal spinal tenderness, no joint edema Neuro: non-focal, well-oriented, appropriate affect Breasts: Deferred   Lab Results  Component Value Date   WBC 7.2 04/15/2023   HGB 13.2 04/15/2023   HCT 39.5 04/15/2023   MCV 89.2 04/15/2023   PLT 187 04/15/2023   No results found for: "FERRITIN", "IRON", "TIBC", "UIBC", "  IRONPCTSAT" Lab Results  Component Value Date   RBC 4.43 04/15/2023   No results found for: "KPAFRELGTCHN", "LAMBDASER", "KAPLAMBRATIO" No results found for: "IGGSERUM", "IGA", "IGMSERUM" No results found for: "TOTALPROTELP", "ALBUMINELP", "A1GS", "A2GS", "BETS", "BETA2SER", "GAMS", "MSPIKE", "SPEI"   Chemistry      Component Value Date/Time   NA 136 10/17/2022 0951   NA 135 10/08/2021 0852   K 5.0 10/17/2022 0951   CL 105 10/17/2022 0951   CO2 24 10/17/2022 0951   BUN 37 (H) 10/17/2022 0951   BUN 23 10/08/2021 0852   CREATININE 2.67 (H) 10/17/2022 0951    CREATININE 2.39 (H) 06/01/2020 1544      Component Value Date/Time   CALCIUM 9.0 10/17/2022 0951   ALKPHOS 49 10/17/2022 0951   AST 12 (L) 10/17/2022 0951   ALT 13 10/17/2022 0951   BILITOT 0.4 10/17/2022 0951       Impression and Plan: Craig Baldwin is a very pleasant 72 yo caucasian gentleman with chronic DVT in the right popliteal vein. 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  He will continue his same regimen with Eliquis 2.5 mg PO BID.    Follow-up in 6 months.   Eileen Stanford, NP 4/24/20248:47 AM

## 2023-04-15 NOTE — Patient Instructions (Addendum)
1. Type 2 diabetes mellitus with hyperglycemia, without long-term current use of insulin Continue jardiance and glipizide. A1c is high when insurance came out to check was 10.2. please get back in with Dr. Lonzo Cloud as you are overdue for visit. You can discuss use of ozempic as add on.  2. Hyperlipidemia associated with type 2 diabetes mellitus Continue crestor.  3. Stage 3a chronic kidney disease Cmp done today thru hemaolgy office. Low gfr. Pt has seen nephrolgist in past[place].  4. Htn- bp conrolled and refilled benazapril 10 mg daily. 90 tab. 3 refills.  Follow up 6 months or sooner if needed.

## 2023-04-15 NOTE — Progress Notes (Signed)
Subjective:    Patient ID: Craig Baldwin, male    DOB: [birth year], 72 y.o.   MRN: [id number]  HPI  Pt in for follow up.  Pt just saw hematologist office today.  Impression and Plan: Craig Baldwin is a very pleasant 72 yo caucasian gentleman with chronic DVT in the right popliteal vein. 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  He will continue his same regimen with Eliquis 2.5 mg PO BID.    Follow-up in 6 months.    Diabetes- last A1c that I see in computer is 10.4 in 09-2021. Pt is on amaryl 1 mg and jardiance 10 mg daily.   Dr. Lonzo Cloud plan in June 2023 was   1) Type 2 Diabetes Mellitus, Poorly  controlled, With CKD III and neuropathic  complications - Most recent A1c of 8.8 %. Goal A1c < 7.0 %.     - His A1c has trended down from 10.4 %  to 6.6% and now its up to 8.8 %  - Pt was under the impression I asked to stop Glimepiride ( we reviewed AVS instructions ) , hence the worsening glycemic control  - Jardiance is costing $100 a month but he is able to pay at this time  - Will increase Jardiance for now and I have asked him to restart Glimepiride  - I emphasized the importance of taking BOTH medications as it has proven that taking one glycemic agent is not sufficient to control his glucose  -GFR remains stable but low, would not increase Jardiance     MEDICATIONS: Continue Jardiance 10 mg, 1 tablet  daily  Restart  Glimepiride 1 mg, 1 tablet before Breakfast    Pt overdue for follow up by 6 months.    Chronic kidney disease-we will get metabolic panel to assess kidney function.   High cholesterol-patient is fasting so we will go ahead and get lipid panel.  Pt has ED. He states rarely can get adequate hard erections.  Htn- controlled when I checked. He needs refill.   Hyperlipidemia- pt ate sausage biscuit today. Pt is on crestor 20 mg daily.    Review of Systems  Constitutional:  Negative for chills, fatigue and fever.   Respiratory:  Negative for cough, chest tightness, shortness of breath and wheezing.   Cardiovascular:  Negative for chest pain and palpitations.  Gastrointestinal:  Negative for constipation and diarrhea.  Genitourinary:  Negative for dysuria, frequency, hematuria and penile pain.  Musculoskeletal:  Negative for back pain, joint swelling and neck pain.  Neurological:  Negative for dizziness, syncope, numbness and headaches.  Hematological:  Negative for adenopathy. Does not bruise/bleed easily.  Psychiatric/Behavioral:  Negative for behavioral problems and decreased concentration.     Past Medical History:  Diagnosis Date   Chronic kidney disease    Diabetes mellitus without complication    Hyperlipidemia    Hypertension      Social History   Socioeconomic History   Marital status: Married    Spouse name: Not on file   Number of children: Not on file   Years of education: Not on file   Highest education level: Not on file  Occupational History   Not on file  Tobacco Use   Smoking status: Former    Types: Cigarettes    Quit date: 12/26/1996    Years since quitting: 26.3   Smokeless tobacco: Former    Types: Chew    Quit date: 12/28/2017  Vaping Use  Vaping Use: Never used  Substance and Sexual Activity   Alcohol use: Not on file   Drug use: Not on file   Sexual activity: Not on file  Other Topics Concern   Not on file  Social History Narrative   Not on file   Social Determinants of Health   Financial Resource Strain: Not on file  Food Insecurity: Not on file  Transportation Needs: Not on file  Physical Activity: Not on file  Stress: Not on file  Social Connections: Not on file  Intimate Partner Violence: Not on file    No past surgical history on file.  No family history on file.  Allergies  Allergen Reactions   Amoxicillin Itching    Current Outpatient Medications on File Prior to Visit  Medication Sig Dispense Refill   apixaban (ELIQUIS) 2.5 MG  TABS tablet Take 1 tablet (2.5 mg total) by mouth 2 (two) times daily. 60 tablet 5   benazepril (LOTENSIN) 10 MG tablet TAKE 1 TABLET BY MOUTH ONCE DAILY . APPOINTMENT REQUIRED FOR FUTURE REFILLS 30 tablet 0   cholecalciferol (VITAMIN D3) 25 MCG (1000 UNIT) tablet Take 1,000 Units by mouth daily.     ELIQUIS 5 MG TABS tablet Take 1 tablet by mouth twice daily 180 tablet 0   empagliflozin (JARDIANCE) 10 MG TABS tablet Take 1 tablet (10 mg total) by mouth daily before breakfast. 90 tablet 1   glimepiride (AMARYL) 1 MG tablet Take 1 tablet by mouth once daily with breakfast 90 tablet 0   rosuvastatin (CRESTOR) 20 MG tablet Take 1 tablet (20 mg total) by mouth daily. Pt needs an office visit for further refills 90 tablet 0   tamsulosin (FLOMAX) 0.4 MG CAPS capsule TAKE 1 CAPSULE BY MOUTH ONCE DAILY AFTER SUPPER 90 capsule 0   triamcinolone ointment (KENALOG) 0.1 % Apply topically 2 (two) times daily as needed. (Patient not taking: Reported on 10/17/2022)     No current facility-administered medications on file prior to visit.    BP 130/70   Pulse (!) 59   Resp 18   Ht  (1.803 m)   Wt 239 lb (108.4 kg)   SpO2 95%   BMI 33.33 kg/m        Objective:   Physical Exam   General Mental Status- Alert. General Appearance- Not in acute distress.   Skin General: Color- Normal Color. Moisture- Normal Moisture.  Neck Carotid Arteries- Normal color. Moisture- Normal Moisture. No carotid bruits. No JVD.  Chest and Lung Exam Auscultation: Breath Sounds:-Normal.  Cardiovascular Auscultation:Rythm- Regular. Murmurs & Other Heart Sounds:Auscultation of the heart reveals- No Murmurs.  Abdomen Inspection:-Inspeection Normal. Palpation/Percussion:Note:No mass. Palpation and Percussion of the abdomen reveal- Non Tender, Non Distended + BS, no rebound or guarding.   Neurologic Cranial Nerve exam:- CN III-XII intact(No nystagmus), symmetric smile. Strength:- 5/5 equal and symmetric  strength both upper and lower extremities.      Assessment & Plan:   Patient Instructions  1. Type 2 diabetes mellitus with hyperglycemia, without long-term current use of insulin Continue jardiance and glipizide. A1c is high when insurance came out to check was 10.2. please get back in with Dr. Lonzo Cloud as you are overdue for visit. You can discuss use of ozempic as add on.  2. Hyperlipidemia associated with type 2 diabetes mellitus Continue crestor.  3. Stage 3a chronic kidney disease Cmp done today thru hemaolgy office. Low gfr. Pt has seen nephrolgist in past[place].  4. Htn- bp conrolled  and refilled benazapril 10 mg daily. 90 tab. 3 refills.  Follow up 6 months or sooner if needed.   Esperanza Richters, PA-C

## 2023-05-04 ENCOUNTER — Other Ambulatory Visit: Payer: Self-pay | Admitting: Cardiology

## 2023-05-13 NOTE — Progress Notes (Unsigned)
Name: Craig Baldwin  MRN/ DOB: [id number], 12/07/1951   Age/ Sex: 72 y.o., male    PCP: Marisue Brooklyn   Reason for Endocrinology Evaluation: Type 2 Diabetes Mellitus     Date of Initial Endocrinology Visit: 02/04/2022    PATIENT IDENTIFIER: Craig Baldwin is a 72 y.o. male with a past medical history of T2DM, CKD, dyslipidemia, obesity and OSA, hx of DVT. The patient presented for initial endocrinology clinic visit on 02/04/2022 for consultative assistance with his diabetes management.    HPI: Craig Baldwin was    Diagnosed with DM years ago  Prior Medications tried/Intolerance: Glimepiride started 12/2021. Actos, Metformin.  Hemoglobin A1c has ranged from 6.4% in 2021, peaking at 10.4 %in 2022.   On his initial visit to our clinic his A1c 6.9 % , down from 10.4% . We continued Glimepiride and switched INvokana to Jardiance   SUBJECTIVE:   During the last visit (06/09/2022): A1c 8.8 %     Today (05/14/23): Craig Baldwin is here for a follow up on diabetes management. He  checks his blood sugars 0 times daily   His wife in the hospital for colon sx  Denies nausea, vomiting  Denies constipation or diarrhea  He has tingling all over his body when he gets up from a bending position  Denies urine infection   HOME DIABETES REGIMEN: Jardiance 10 mg daily  Glimepiride 1 mg daily     Statin: yes ACE-I/ARB: yes Prior Diabetic Education: Yes, saw Vernona Rieger 12/2021   METER DOWNLOAD SUMMARY: did not bring    DIABETIC COMPLICATIONS: Microvascular complications:  CKD, neuropathy Denies: retinopathy Last eye exam: Completed does not recall   Macrovascular complications:   Denies: CAD, PVD, CVA   PAST HISTORY: Past Medical History:  Past Medical History:  Diagnosis Date   Chronic kidney disease    Diabetes mellitus without complication (HCC)    Hyperlipidemia    Hypertension    Past Surgical History: No past surgical history on file.  Social History:  reports  that he quit smoking about 26 years ago. His smoking use included cigarettes. He quit smokeless tobacco use about 5 years ago.  His smokeless tobacco use included chew. No history on file for alcohol use and drug use. Family History: No family history on file.   HOME MEDICATIONS: Allergies as of 05/14/2023       Reactions   Amoxicillin Itching        Medication List        Accurate as of May 14, 2023  9:38 AM. If you have any questions, ask your nurse or doctor.          apixaban 2.5 MG Tabs tablet Commonly known as: Eliquis Take 1 tablet (2.5 mg total) by mouth 2 (two) times daily.   Eliquis 5 MG Tabs tablet Generic drug: apixaban Take 1 tablet by mouth twice daily   benazepril 10 MG tablet Commonly known as: LOTENSIN TAKE 1 TABLET BY MOUTH ONCE DAILY .   cholecalciferol 25 MCG (1000 UNIT) tablet Commonly known as: VITAMIN D3 Take 1,000 Units by mouth daily.   empagliflozin 25 MG Tabs tablet Commonly known as: Jardiance Take 1 tablet (25 mg total) by mouth daily before breakfast. What changed:  medication strength how much to take Changed by: Scarlette Shorts, MD   glimepiride 1 MG tablet Commonly known as: AMARYL Take 1 tablet by mouth once daily with breakfast   Ozempic (0.25 or 0.5 MG/DOSE) 2 MG/1.5ML Sopn Generic drug:  Semaglutide(0.25 or 0.5MG /DOS) Inject 0.5 mg into the skin once a week. Started by: Scarlette Shorts, MD   rosuvastatin 20 MG tablet Commonly known as: CRESTOR TAKE 1 TABLET BY MOUTH ONCE DAILY . APPOINTMENT REQUIRED FOR FUTURE REFILLS   tamsulosin 0.4 MG Caps capsule Commonly known as: FLOMAX TAKE 1 CAPSULE BY MOUTH ONCE DAILY AFTER SUPPER   triamcinolone ointment 0.1 % Commonly known as: KENALOG Apply topically 2 (two) times daily as needed.         ALLERGIES: Allergies  Allergen Reactions   Amoxicillin Itching     REVIEW OF SYSTEMS: A comprehensive ROS was conducted with the patient and is negative except  as per HPI    OBJECTIVE:   VITAL SIGNS: BP 120/70 (BP Location: Left Arm, Patient Position: Sitting, Cuff Size: Large)   Pulse 66   Ht 5\' 11"  (1.803 m)   Wt 236 lb (107 kg)   SpO2 97%   BMI 32.92 kg/m    PHYSICAL EXAM:  General: Pt appears well and is in NAD  Chest: Clear with good BS bilat   Heart: RRR  Abdomen: Soft, nontender  Extremities:  Lower extremities - No pretibial edema  Neuro: MS is good with appropriate affect, pt is alert and Ox3    DM foot exam: 05/14/2023  The skin of the feet is intact without sores or ulcerations. The pedal pulses are 2+ on right and 2+ on left. The sensation is decreased to a screening 5.07, 10 gram monofilament bilaterally at the toes    DATA REVIEWED:  Lab Results  Component Value Date   HGBA1C 8.9 (A) 05/14/2023   HGBA1C 8.8 (A) 06/09/2022   HGBA1C 6.9 (A) 02/04/2022    Latest Reference Range & Units 04/15/23 08:29  Sodium 135 - 145 mmol/L 134 (L)  Potassium 3.5 - 5.1 mmol/L 5.0  Chloride 98 - 111 mmol/L 107  CO2 22 - 32 mmol/L 23  Glucose 70 - 99 mg/dL 161 (H)  BUN 8 - 23 mg/dL 39 (H)  Creatinine 0.96 - 1.24 mg/dL 0.45 (H)  Calcium 8.9 - 10.3 mg/dL 9.3  Anion gap 5 - 15  4 (L)  Alkaline Phosphatase 38 - 126 U/L 51  Albumin 3.5 - 5.0 g/dL 4.2  AST 15 - 41 U/L 12 (L)  ALT 0 - 44 U/L 14  Total Protein 6.5 - 8.1 g/dL 6.8  Total Bilirubin 0.3 - 1.2 mg/dL 0.4  GFR, Est Non African American >60 mL/min 26 (L)  LDH 98 - 192 U/L 117  (L): Data is abnormally low (H): Data is abnormally high  ASSESSMENT / PLAN / RECOMMENDATIONS:   1) Type 2 Diabetes Mellitus, Poorly  controlled, With CKD IV and neuropathic  complications - Most recent A1c of 8.9 %. Goal A1c < 7.0 %.    -Patient continues with hyperglycemia with an A1c of 8.9%, I am concerned that he does not remember to take his medication -His A1c was 6.6% on Jardiance and glimepiride, then it increased to 8.8% but this is because he was not taking the glimepiride, I am  surprised that despite restarting the glimepiride on the last visit that his A1c remains at 8.9%? -I did ask the patient to go home and verify medication bottles, I did advise the patient to eat proper meals and avoid extra snacks (this morning he had crackers with coffee, and he will eat breakfast later)  -I did explain to the patient that he has CKD IV, we discussed the importance  of optimizing glucose control to slow down or prevent the risk of dialysis -Patient is requesting Ozempic as a family member is on it, cautioned against GI side effects, patient has no history of pancreatitis -He was trained by my CMA on proper injection technique -Will increase Jardiance -He was provided with patient assistance papers for Jardiance and Ozempic   MEDICATIONS: Start Ozempic 0.25 mg weekly for 6 weeks, then increase to 0.5 mg weekly Increase Jardiance 25 mg, 1 tablet  daily  Continue glimepiride 1 mg, 1 tablet before Breakfast   EDUCATION / INSTRUCTIONS: BG monitoring instructions: Patient does't trust the validity of glucose meter  Call [facility] if: BG persistently < 70 I reviewed the Rule of 15 for the treatment of hypoglycemia in detail with the patient. Literature supplied.   2) Diabetic complications:  Eye: Does not have known diabetic retinopathy.  Neuro/ Feet: Does  have known diabetic peripheral neuropathy. Renal: Patient does  have known baseline CKD. Follows with Dr. Milas Kocher with [facility]   3) Dyslipidemia :  -LDL has been at goal, patient is on rosuvastatin 20 mg daily  F/U in 4 months    Signed electronically by: Lyndle Herrlich, MD  [facility] Endocrinology  [facility] [address] [place], [place] [address] Phone: [phone] FAX: [phone]   CC: Marisue Brooklyn [address] [place] [address] Phone: [phone]  Fax: [phone]    Return to Endocrinology clinic as  below: Future Appointments  Date Time Provider Department Center  10/12/2023  8:15 AM [facility] None  10/12/2023  8:30 AM Erenest Blank, NP [facility] None  10/12/2023  9:20 AM Saguier, Ramon Dredge, PA-C [facility]  10/22/2023  8:10 AM Daizee Firmin, Konrad Dolores, MD [facility] None

## 2023-05-14 ENCOUNTER — Telehealth: Payer: Self-pay | Admitting: Internal Medicine

## 2023-05-14 ENCOUNTER — Ambulatory Visit: Payer: Medicare HMO | Admitting: Internal Medicine

## 2023-05-14 ENCOUNTER — Telehealth: Payer: Self-pay | Admitting: Medical

## 2023-05-14 ENCOUNTER — Encounter: Payer: Self-pay | Admitting: Internal Medicine

## 2023-05-14 VITALS — BP 120/70 | HR 66 | Ht 71.0 in | Wt 236.0 lb

## 2023-05-14 DIAGNOSIS — E1142 Type 2 diabetes mellitus with diabetic polyneuropathy: Secondary | ICD-10-CM | POA: Diagnosis not present

## 2023-05-14 DIAGNOSIS — Z7984 Long term (current) use of oral hypoglycemic drugs: Secondary | ICD-10-CM | POA: Diagnosis not present

## 2023-05-14 DIAGNOSIS — Z7985 Long-term (current) use of injectable non-insulin antidiabetic drugs: Secondary | ICD-10-CM

## 2023-05-14 DIAGNOSIS — E1165 Type 2 diabetes mellitus with hyperglycemia: Secondary | ICD-10-CM

## 2023-05-14 DIAGNOSIS — N184 Chronic kidney disease, stage 4 (severe): Secondary | ICD-10-CM

## 2023-05-14 DIAGNOSIS — E1122 Type 2 diabetes mellitus with diabetic chronic kidney disease: Secondary | ICD-10-CM

## 2023-05-14 LAB — POCT GLUCOSE (DEVICE FOR HOME USE): POC Glucose: 195 mg/dl — AB (ref 70–99)

## 2023-05-14 LAB — POCT GLYCOSYLATED HEMOGLOBIN (HGB A1C): Hemoglobin A1C: 8.9 % — AB (ref 4.0–5.6)

## 2023-05-14 MED ORDER — OZEMPIC (0.25 OR 0.5 MG/DOSE) 2 MG/1.5ML ~~LOC~~ SOPN: 0.5000 mg | PEN_INJECTOR | SUBCUTANEOUS | 3 refills | Status: DC

## 2023-05-14 MED ORDER — EMPAGLIFLOZIN 25 MG PO TABS: 25.0000 mg | ORAL_TABLET | Freq: Every day | ORAL | 6 refills | Status: DC

## 2023-05-14 NOTE — Telephone Encounter (Signed)
Pt called and lvm to return call 

## 2023-05-14 NOTE — Telephone Encounter (Signed)
Patient completed patient assistance paperwork for [employer] . Placed in provider box at front desk

## 2023-05-14 NOTE — Patient Instructions (Signed)
Start Ozempic 0.25 mg once weekly for 6 weeks, than increase to 0.5 mg weekly  Increase Jardiance 25 mg daily  Continue Glimepiride 1 mg, 1 tablet before Breakfast      HOW TO TREAT LOW BLOOD SUGARS (Blood sugar LESS THAN 70 MG/DL) Please follow the RULE OF 15 for the treatment of hypoglycemia treatment (when your (blood sugars are less than 70 mg/dL)   STEP 1: Take 15 grams of carbohydrates when your blood sugar is low, which includes:  3-4 GLUCOSE TABS  OR 3-4 OZ OF JUICE OR REGULAR SODA OR ONE TUBE OF GLUCOSE GEL    STEP 2: RECHECK blood sugar in 15 MINUTES STEP 3: If your blood sugar is still low at the 15 minute recheck --> then, go back to STEP 1 and treat AGAIN with another 15 grams of carbohydrates.

## 2023-05-14 NOTE — Telephone Encounter (Signed)
Patient said that Ramon Dredge told him to check with Dr. Lonzo Cloud about something but he cannot remember what that was. Please call patient to advise what he was supposed to check on.

## 2023-05-15 NOTE — Telephone Encounter (Signed)
Both patient assistance application has been completed and faxed

## 2023-05-17 ENCOUNTER — Other Ambulatory Visit: Payer: Self-pay | Admitting: Medical

## 2023-05-22 DIAGNOSIS — R300 Dysuria: Secondary | ICD-10-CM | POA: Diagnosis not present

## 2023-05-22 DIAGNOSIS — I10 Essential (primary) hypertension: Secondary | ICD-10-CM | POA: Diagnosis not present

## 2023-05-22 DIAGNOSIS — E211 Secondary hyperparathyroidism, not elsewhere classified: Secondary | ICD-10-CM | POA: Diagnosis not present

## 2023-05-22 DIAGNOSIS — E785 Hyperlipidemia, unspecified: Secondary | ICD-10-CM | POA: Diagnosis not present

## 2023-05-22 DIAGNOSIS — D649 Anemia, unspecified: Secondary | ICD-10-CM | POA: Diagnosis not present

## 2023-05-22 DIAGNOSIS — E559 Vitamin D deficiency, unspecified: Secondary | ICD-10-CM | POA: Diagnosis not present

## 2023-05-22 DIAGNOSIS — N183 Chronic kidney disease, stage 3 unspecified: Secondary | ICD-10-CM | POA: Diagnosis not present

## 2023-05-22 DIAGNOSIS — E1169 Type 2 diabetes mellitus with other specified complication: Secondary | ICD-10-CM | POA: Diagnosis not present

## 2023-05-22 DIAGNOSIS — E669 Obesity, unspecified: Secondary | ICD-10-CM | POA: Diagnosis not present

## 2023-05-22 DIAGNOSIS — R809 Proteinuria, unspecified: Secondary | ICD-10-CM | POA: Diagnosis not present

## 2023-05-30 ENCOUNTER — Other Ambulatory Visit: Payer: Self-pay | Admitting: Internal Medicine

## 2023-06-11 ENCOUNTER — Telehealth: Payer: Self-pay

## 2023-06-11 NOTE — Telephone Encounter (Signed)
Called and spoke to Craig Baldwin and let him know that his patient assistance Ozempic .68 mg/ml is here he can pick up anytime between 8:00 am to 4:30 pm

## 2023-06-29 NOTE — Telephone Encounter (Signed)
Patient picked up 5 boxes Ozempic patient assistance - log noted

## 2023-07-05 ENCOUNTER — Other Ambulatory Visit: Payer: Self-pay | Admitting: Cardiology

## 2023-07-13 ENCOUNTER — Telehealth: Payer: Self-pay | Admitting: Cardiology

## 2023-07-13 MED ORDER — ROSUVASTATIN CALCIUM 20 MG PO TABS: 20.0000 mg | ORAL_TABLET | Freq: Every day | ORAL | 0 refills | Status: DC

## 2023-07-13 NOTE — Telephone Encounter (Signed)
*  STAT* If patient is at the pharmacy, call can be transferred to refill team.   1. Which medications need to be refilled? (please list name of each medication and dose if known) rosuvastatin (CRESTOR) 20 MG tablet  2. Which pharmacy/location (including street and city if local pharmacy) is medication to be sent to? [facility] - [place], [place] - [address]  3. Do they need a 30 day or 90 day supply? 90 day supply

## 2023-07-20 ENCOUNTER — Telehealth: Payer: Self-pay

## 2023-07-20 NOTE — Telephone Encounter (Signed)
Patient has been experiencing right lower back (kidney) pain and wants to know could this be from the Ozempic.

## 2023-07-27 ENCOUNTER — Telehealth: Payer: Self-pay | Admitting: Cardiology

## 2023-07-27 NOTE — Telephone Encounter (Signed)
Called pt to inform him that his medication was already sent to his pharmacy as requested. I advised the pt that if he has any other problems, questions or concerns, to give our office a call. Pt verbalized understanding.  °

## 2023-07-27 NOTE — Telephone Encounter (Signed)
*  STAT* If patient is at the pharmacy, call can be transferred to refill team.   1. Which medications need to be refilled? (please list name of each medication and dose if known) rosuvastatin (CRESTOR) 20 MG tablet   2. Which pharmacy/location (including street and city if local pharmacy) is medication to be sent to?[facility] - [place], [place] - [address]   3. Do they need a 30 day or 90 day supply? 90 Day Supply.  Pt has an appt scheduled for 10/07.

## 2023-07-29 ENCOUNTER — Encounter: Payer: Self-pay | Admitting: Internal Medicine

## 2023-07-29 ENCOUNTER — Ambulatory Visit: Payer: PPO | Admitting: Internal Medicine

## 2023-07-29 VITALS — BP 122/74 | HR 66 | Ht 71.0 in | Wt 229.0 lb

## 2023-07-29 DIAGNOSIS — E1122 Type 2 diabetes mellitus with diabetic chronic kidney disease: Secondary | ICD-10-CM | POA: Diagnosis not present

## 2023-07-29 DIAGNOSIS — Z7984 Long term (current) use of oral hypoglycemic drugs: Secondary | ICD-10-CM

## 2023-07-29 DIAGNOSIS — E1142 Type 2 diabetes mellitus with diabetic polyneuropathy: Secondary | ICD-10-CM

## 2023-07-29 DIAGNOSIS — Z7985 Long-term (current) use of injectable non-insulin antidiabetic drugs: Secondary | ICD-10-CM | POA: Diagnosis not present

## 2023-07-29 DIAGNOSIS — N184 Chronic kidney disease, stage 4 (severe): Secondary | ICD-10-CM | POA: Diagnosis not present

## 2023-07-29 LAB — POCT GLYCOSYLATED HEMOGLOBIN (HGB A1C): Hemoglobin A1C: 6.6 % — AB (ref 4.0–5.6)

## 2023-07-29 LAB — POCT GLUCOSE (DEVICE FOR HOME USE): Glucose Fasting, POC: 108 mg/dL — AB (ref 70–99)

## 2023-07-29 MED ORDER — EMPAGLIFLOZIN 25 MG PO TABS: 25.0000 mg | ORAL_TABLET | Freq: Every day | ORAL | 11 refills | Status: DC

## 2023-07-29 MED ORDER — GLIMEPIRIDE 1 MG PO TABS: 1.0000 mg | ORAL_TABLET | Freq: Every day | ORAL | 3 refills | Status: DC

## 2023-07-29 NOTE — Progress Notes (Signed)
Name: Craig Baldwin  MRN/ DOB: [id number], 1951-08-27   Age/ Sex: 72 y.o., male    PCP: Marisue Brooklyn   Reason for Endocrinology Evaluation: Type 2 Diabetes Mellitus     Date of Initial Endocrinology Visit: 02/04/2022    PATIENT IDENTIFIER: Craig Baldwin is a 72 y.o. male with a past medical history of T2DM, CKD, dyslipidemia, obesity and OSA, hx of DVT. The patient presented for initial endocrinology clinic visit on 02/04/2022 for consultative assistance with his diabetes management.    HPI: Craig Baldwin was    Diagnosed with DM years ago  Prior Medications tried/Intolerance: Glimepiride started 12/2021. Actos, Metformin.  Hemoglobin A1c has ranged from 6.4% in 2021, peaking at 10.4 %in 2022.   On his initial visit to our clinic his A1c 6.9 % , down from 10.4% . We continued Glimepiride and switched INvokana to [place]   Did not qualify for patient assistance for Jardiance 2024 Started Ozempic 04/2023, was approved for patient assistance 05/2023  SUBJECTIVE:   During the last visit (05/14/2023): A1c 8.9 %     Today (07/29/23): Craig Baldwin is here for a follow up on diabetes management. He  checks his blood sugars 0 times daily    Jardiance patient assistance application was denied 04/2023, but Ozempic was approved but he is not taking it because he read that it causes kidney problems He continues to follow-up with nephrology for CKD  Denies nausea, vomiting  Has occasional  has noted some loose bowels with ozempic    HOME DIABETES REGIMEN: Ozempic 0.5 mg weekly- not taking  Jardiance 25 mg daily  Glimepiride 1 mg daily    Statin: yes ACE-I/ARB: yes Prior Diabetic Education: Yes, saw Vernona Rieger 12/2021   METER DOWNLOAD SUMMARY: did not bring    DIABETIC COMPLICATIONS: Microvascular complications:  CKD, neuropathy Denies: retinopathy Last eye exam: Completed does not recall   Macrovascular complications:   Denies: CAD, PVD, CVA   PAST  HISTORY: Past Medical History:  Past Medical History:  Diagnosis Date   Chronic kidney disease    Diabetes mellitus without complication (HCC)    Hyperlipidemia    Hypertension    Past Surgical History: No past surgical history on file.  Social History:  reports that he quit smoking about 26 years ago. His smoking use included cigarettes. He quit smokeless tobacco use about 5 years ago.  His smokeless tobacco use included chew. No history on file for alcohol use and drug use. Family History: No family history on file.   HOME MEDICATIONS: Allergies as of 07/29/2023       Reactions   Amoxicillin Itching        Medication List        Accurate as of July 29, 2023 11:02 AM. If you have any questions, ask your nurse or doctor.          apixaban 2.5 MG Tabs tablet Commonly known as: Eliquis Take 1 tablet (2.5 mg total) by mouth 2 (two) times daily.   Eliquis 5 MG Tabs tablet Generic drug: apixaban Take 1 tablet by mouth twice daily   benazepril 10 MG tablet Commonly known as: LOTENSIN TAKE 1 TABLET BY MOUTH ONCE DAILY .   cholecalciferol 25 MCG (1000 UNIT) tablet Commonly known as: VITAMIN D3 Take 1,000 Units by mouth daily.   empagliflozin 25 MG Tabs tablet Commonly known as: Jardiance Take 1 tablet (25 mg total) by mouth daily before breakfast.   glimepiride 1 MG tablet Commonly known as:  AMARYL Take 1 tablet by mouth once daily with breakfast   Ozempic (0.25 or 0.5 MG/DOSE) 2 MG/1.5ML Sopn Generic drug: Semaglutide(0.25 or 0.5MG /DOS) Inject 0.5 mg into the skin once a week.   rosuvastatin 20 MG tablet Commonly known as: CRESTOR Take 1 tablet (20 mg total) by mouth daily.   tamsulosin 0.4 MG Caps capsule Commonly known as: FLOMAX TAKE 1 CAPSULE BY MOUTH ONCE DAILY AFTER SUPPER   triamcinolone ointment 0.1 % Commonly known as: KENALOG Apply topically 2 (two) times daily as needed.         ALLERGIES: Allergies  Allergen Reactions   Amoxicillin  Itching     REVIEW OF SYSTEMS: A comprehensive ROS was conducted with the patient and is negative except as per HPI    OBJECTIVE:   VITAL SIGNS: BP 122/74 (BP Location: Left Arm, Patient Position: Sitting, Cuff Size: Large)   Pulse 66   Ht 5\' 11"  (1.803 m)   Wt 229 lb (103.9 kg)   SpO2 96%   BMI 31.94 kg/m    PHYSICAL EXAM:  General: Pt appears well and is in NAD  Chest: Clear with good BS bilat   Heart: RRR  Extremities:  Lower extremities - No pretibial edema  Neuro: MS is good with appropriate affect, pt is alert and Ox3    DM foot exam: 05/14/2023  The skin of the feet is intact without sores or ulcerations. The pedal pulses are 2+ on right and 2+ on left. The sensation is decreased to a screening 5.07, 10 gram monofilament bilaterally at the toes    DATA REVIEWED:  Lab Results  Component Value Date   HGBA1C 6.6 (A) 07/29/2023   HGBA1C 8.9 (A) 05/14/2023   HGBA1C 8.8 (A) 06/09/2022    Latest Reference Range & Units 04/15/23 08:29  Sodium 135 - 145 mmol/L 134 (L)  Potassium 3.5 - 5.1 mmol/L 5.0  Chloride 98 - 111 mmol/L 107  CO2 22 - 32 mmol/L 23  Glucose 70 - 99 mg/dL 161 (H)  BUN 8 - 23 mg/dL 39 (H)  Creatinine 0.96 - 1.24 mg/dL 0.45 (H)  Calcium 8.9 - 10.3 mg/dL 9.3  Anion gap 5 - 15  4 (L)  Alkaline Phosphatase 38 - 126 U/L 51  Albumin 3.5 - 5.0 g/dL 4.2  AST 15 - 41 U/L 12 (L)  ALT 0 - 44 U/L 14  Total Protein 6.5 - 8.1 g/dL 6.8  Total Bilirubin 0.3 - 1.2 mg/dL 0.4  GFR, Est Non African American >60 mL/min 26 (L)  LDH 98 - 192 U/L 117    In office BG 108 Mg/DL  ASSESSMENT / PLAN / RECOMMENDATIONS:   1) Type 2 Diabetes Mellitus, optimally controlled, With CKD IV and neuropathic  complications - Most recent A1c of 6.6 %. Goal A1c < 7.0 %.    -His A1c has trended down from 8.9% to 6.6% -He is getting Ozempic through patient assistance, he did not qualify to get Jardiance through patient assistance, he is getting it through the pharmacy -Patient  held Ozempic as he read online that it causes kidney damage, I did explain to the patient that in reviewing the literature GLP-1 agonist have been beneficial to preserve kidney function, and the only time he can have AKI if he develops GI side effects which would cause dehydration.  Patient has no side effects to Ozempic, I have encouraged him to restarted  MEDICATIONS: Continue Ozempic 0.5 mg weekly Continue Jardiance 25 mg, 1 tablet  daily  Continue glimepiride 1 mg, 1 tablet before Breakfast   EDUCATION / INSTRUCTIONS: BG monitoring instructions: Patient does't trust the validity of glucose meter  Call [place] [facility] if: BG persistently < 70 I reviewed the Rule of 15 for the treatment of hypoglycemia in detail with the patient. Literature supplied.   2) Diabetic complications:  Eye: Does not have known diabetic retinopathy.  Neuro/ Feet: Does  have known diabetic peripheral neuropathy. Renal: Patient does  have known baseline CKD. Follows with Dr. Milas Kocher with [facility]   3) Dyslipidemia :  -LDL has been at goal, patient is on rosuvastatin 20 mg daily    F/U in 6 months    Signed electronically by: Lyndle Herrlich, MD  [facility] Endocrinology  [facility] [address] [place], [place] [address] Phone: [phone] FAX: [phone]   CC: Marisue Brooklyn [address] [place] [address] Phone: [phone]  Fax: [phone]    Return to Endocrinology clinic as below: Future Appointments  Date Time Provider Department Center  09/28/2023 10:30 AM Tereso Newcomer T, PA-C [facility]  10/12/2023  8:15 AM [facility] None  10/12/2023  8:30 AM Erenest Blank, NP [facility] None  10/12/2023  9:20 AM Saguier, Kateri Mc [facility]

## 2023-07-29 NOTE — Patient Instructions (Addendum)
Continue Ozempic 0.5 mg weekly  Continue  Jardiance 25 mg daily  Continue Glimepiride 1 mg, 1 tablet before Breakfast      HOW TO TREAT LOW BLOOD SUGARS (Blood sugar LESS THAN 70 MG/DL) Please follow the RULE OF 15 for the treatment of hypoglycemia treatment (when your (blood sugars are less than 70 mg/dL)   STEP 1: Take 15 grams of carbohydrates when your blood sugar is low, which includes:  3-4 GLUCOSE TABS  OR 3-4 OZ OF JUICE OR REGULAR SODA OR ONE TUBE OF GLUCOSE GEL    STEP 2: RECHECK blood sugar in 15 MINUTES STEP 3: If your blood sugar is still low at the 15 minute recheck --> then, go back to STEP 1 and treat AGAIN with another 15 grams of carbohydrates.

## 2023-08-23 ENCOUNTER — Other Ambulatory Visit: Payer: Self-pay | Admitting: Medical

## 2023-09-21 NOTE — Telephone Encounter (Signed)
Patient came in to office and picked up 5 boxes of Patient Assistance Ozempic.

## 2023-09-25 DIAGNOSIS — I77810 Thoracic aortic ectasia: Secondary | ICD-10-CM | POA: Insufficient documentation

## 2023-09-25 DIAGNOSIS — I2510 Atherosclerotic heart disease of native coronary artery without angina pectoris: Secondary | ICD-10-CM | POA: Insufficient documentation

## 2023-09-25 DIAGNOSIS — I700 Atherosclerosis of aorta: Secondary | ICD-10-CM | POA: Insufficient documentation

## 2023-09-25 NOTE — Progress Notes (Unsigned)
Cardiology Office Note:    Date:  09/28/2023  ID:  Craig Baldwin, DOB 08/21/1951, MRN [id number] PCP: Marisue Brooklyn  [facility] Providers Cardiologist:  Donato Schultz, MD       Patient Profile:      Coronary artery calcification CAC score 03/05/2022: 28.1 (27th percentile) Myoview 03/05/2022: Normal perfusion, EF 57, low risk Hypertension Hyperlipidemia Hx of DVT Factor V Leiden; Eliquis Aortic atherosclerosis Dilated aortic root CT 02/2022: 42 mm Diabetes mellitus Chronic kidney disease         History of Present Illness:  Discussed the use of AI scribe software for clinical note transcription with the patient, who gave verbal consent to proceed.  Craig Baldwin is a 72 y.o. male who returns for follow up of coronar artery calcification.  He is here alone.  He is doing well without chest pain, shortness of breath, syncope, orthopnea, leg edema.    ROS   See HPI    Studies Reviewed:   EKG Interpretation Date/Time:  Monday September 28 2023 10:25:42 EDT Ventricular Rate:  71 PR Interval:  178 QRS Duration:  86 QT Interval:  368 QTC Calculation: 399 R Axis:   7  Text Interpretation: Normal sinus rhythm Low voltage QRS No significant change since last tracing Confirmed by Tereso Newcomer [phone]) on 09/28/2023 10:51:39 AM    Risk Assessment/Calculations:             Physical Exam:   VS:  BP 110/68   Pulse 71   Ht 5\' 11"  (1.803 m)   Wt 230 lb 6.4 oz (104.5 kg)   SpO2 98%   BMI 32.13 kg/m    Wt Readings from Last 3 Encounters:  09/28/23 230 lb 6.4 oz (104.5 kg)  07/29/23 229 lb (103.9 kg)  05/14/23 236 lb (107 kg)    Constitutional:      Appearance: Healthy appearance. Not in distress.  Neck:     Vascular: No JVR. JVD normal.  Pulmonary:     Breath sounds: Normal breath sounds. No wheezing. No rales.  Cardiovascular:     Normal rate. Regular rhythm.     Murmurs: There is no murmur.  Edema:    Peripheral edema absent.  Abdominal:      Palpations: Abdomen is soft.        Assessment and Plan:   Assessment & Plan Coronary artery calcification CAC score of 28.1 (27th percentile) in March 2023. No chest discomfort suggestive of angina. Low risk nuclear stress test in March 2023. Not on antiplatelet therapy due to concurrent anticoagulation. LDL optimal on Rosuvastatin 20mg  daily. -Follow-up in 2 years. Dilated aortic root (HCC) Measured 42mm by CT scan in March 2023.  -Arrange echocardiogram for continued surveillance. -Follow-up office visit in 2 years. Essential hypertension Controlled on Benazepril 10mg  daily Mixed hyperlipidemia Managed by PCP. LDL optimal at 52 in February 2023 on Rosuvastatin 20mg  daily. CKD (chronic kidney disease) stage 4, GFR 15-29 ml/min (HCC) Managed by nephrology in [facility]. Factor V Leiden (HCC) On chronic anticoagulation with Eliquis 2.5mg  twice daily. He is followed by hematology.       Dispo:  Return in about 2 years (around 09/27/2025) for Routine Follow Up, w/ Dr. Anne Fu.  Signed, Tereso Newcomer, PA-C

## 2023-09-28 ENCOUNTER — Ambulatory Visit: Payer: PPO | Attending: Physician Assistant | Admitting: Physician Assistant

## 2023-09-28 ENCOUNTER — Encounter: Payer: Self-pay | Admitting: Physician Assistant

## 2023-09-28 VITALS — BP 110/68 | HR 71 | Ht 71.0 in | Wt 230.4 lb

## 2023-09-28 DIAGNOSIS — D6851 Activated protein C resistance: Secondary | ICD-10-CM

## 2023-09-28 DIAGNOSIS — I10 Essential (primary) hypertension: Secondary | ICD-10-CM

## 2023-09-28 DIAGNOSIS — I77810 Thoracic aortic ectasia: Secondary | ICD-10-CM | POA: Diagnosis not present

## 2023-09-28 DIAGNOSIS — E782 Mixed hyperlipidemia: Secondary | ICD-10-CM | POA: Diagnosis not present

## 2023-09-28 DIAGNOSIS — N184 Chronic kidney disease, stage 4 (severe): Secondary | ICD-10-CM

## 2023-09-28 DIAGNOSIS — I2510 Atherosclerotic heart disease of native coronary artery without angina pectoris: Secondary | ICD-10-CM

## 2023-09-28 DIAGNOSIS — I700 Atherosclerosis of aorta: Secondary | ICD-10-CM

## 2023-09-28 NOTE — Patient Instructions (Signed)
Medication Instructions:  Your physician recommends that you continue on your current medications as directed. Please refer to the Current Medication list given to you today.   *If you need a refill on your cardiac medications before your next appointment, please call your pharmacy*   Lab Work: None ordered  If you have labs (blood work) drawn today and your tests are completely normal, you will receive your results only by: MyChart Message (if you have MyChart) OR A paper copy in the mail If you have any lab test that is abnormal or we need to change your treatment, we will call you to review the results.   Testing/Procedures: Your physician has requested that you have an echocardiogram. Echocardiography is a painless test that uses sound waves to create images of your heart. It provides your doctor with information about the size and shape of your heart and how well your heart's chambers and valves are working. This procedure takes approximately one hour. There are no restrictions for this procedure. Please do NOT wear cologne, perfume, aftershave, or lotions (deodorant is allowed). Please arrive 15 minutes prior to your appointment time.    Follow-Up: At [facility], you and your health needs are our priority.  As part of our continuing mission to provide you with exceptional heart care, we have created designated Provider Care Teams.  These Care Teams include your primary Cardiologist (physician) and Advanced Practice Providers (APPs -  Physician Assistants and Nurse Practitioners) who all work together to provide you with the care you need, when you need it.  We recommend signing up for the patient portal called "MyChart".  Sign up information is provided on this After Visit Summary.  MyChart is used to connect with patients for Virtual Visits (Telemedicine).  Patients are able to view lab/test results, encounter notes, upcoming appointments, etc.  Non-urgent messages can be  sent to your provider as well.   To learn more about what you can do with MyChart, go to [email or web address].    Your next appointment:   2 year(s)  Provider:   Donato Schultz, MD     Other Instructions

## 2023-09-28 NOTE — Assessment & Plan Note (Addendum)
CAC score of 28.1 (27th percentile) in March 2023. No chest discomfort suggestive of angina. Low risk nuclear stress test in March 2023. Not on antiplatelet therapy due to concurrent anticoagulation. LDL optimal on Rosuvastatin 20mg  daily. -Follow-up in 2 years.

## 2023-09-28 NOTE — Assessment & Plan Note (Signed)
On chronic anticoagulation with Eliquis 2.5mg  twice daily. He is followed by hematology.

## 2023-09-28 NOTE — Assessment & Plan Note (Signed)
Controlled on Benazepril 10mg  daily

## 2023-09-28 NOTE — Assessment & Plan Note (Signed)
Measured 42mm by CT scan in March 2023.  -Arrange echocardiogram for continued surveillance. -Follow-up office visit in 2 years.

## 2023-09-28 NOTE — Assessment & Plan Note (Signed)
Managed by PCP. LDL optimal at 52 in February 2023 on Rosuvastatin 20mg  daily.

## 2023-10-05 ENCOUNTER — Other Ambulatory Visit: Payer: Self-pay | Admitting: Hematology & Oncology

## 2023-10-05 DIAGNOSIS — I82431 Acute embolism and thrombosis of right popliteal vein: Secondary | ICD-10-CM

## 2023-10-05 MED ORDER — APIXABAN 2.5 MG PO TABS: 2.5000 mg | ORAL_TABLET | Freq: Two times a day (BID) | ORAL | 5 refills | Status: DC

## 2023-10-12 ENCOUNTER — Encounter: Payer: Self-pay | Admitting: Medical Oncology

## 2023-10-12 ENCOUNTER — Inpatient Hospital Stay: Payer: PPO

## 2023-10-12 ENCOUNTER — Inpatient Hospital Stay (HOSPITAL_BASED_OUTPATIENT_CLINIC_OR_DEPARTMENT_OTHER): Payer: PPO | Admitting: Medical Oncology

## 2023-10-12 ENCOUNTER — Ambulatory Visit: Payer: Medicare HMO | Admitting: Medical

## 2023-10-12 ENCOUNTER — Ambulatory Visit (INDEPENDENT_AMBULATORY_CARE_PROVIDER_SITE_OTHER): Payer: PPO | Admitting: Medical

## 2023-10-12 ENCOUNTER — Ambulatory Visit: Payer: Medicare HMO | Admitting: Family

## 2023-10-12 ENCOUNTER — Inpatient Hospital Stay: Payer: PPO | Attending: Family

## 2023-10-12 VITALS — BP 114/64 | HR 58 | Temp 98.2°F | Resp 18 | Ht 71.0 in | Wt 229.0 lb

## 2023-10-12 VITALS — BP 125/59 | HR 64 | Temp 97.9°F | Resp 20 | Ht 71.0 in | Wt 229.0 lb

## 2023-10-12 DIAGNOSIS — I82431 Acute embolism and thrombosis of right popliteal vein: Secondary | ICD-10-CM | POA: Insufficient documentation

## 2023-10-12 DIAGNOSIS — Z7984 Long term (current) use of oral hypoglycemic drugs: Secondary | ICD-10-CM | POA: Diagnosis not present

## 2023-10-12 DIAGNOSIS — E1165 Type 2 diabetes mellitus with hyperglycemia: Secondary | ICD-10-CM

## 2023-10-12 DIAGNOSIS — D6851 Activated protein C resistance: Secondary | ICD-10-CM | POA: Diagnosis not present

## 2023-10-12 DIAGNOSIS — D6859 Other primary thrombophilia: Secondary | ICD-10-CM

## 2023-10-12 DIAGNOSIS — R112 Nausea with vomiting, unspecified: Secondary | ICD-10-CM

## 2023-10-12 DIAGNOSIS — J309 Allergic rhinitis, unspecified: Secondary | ICD-10-CM | POA: Diagnosis not present

## 2023-10-12 DIAGNOSIS — G629 Polyneuropathy, unspecified: Secondary | ICD-10-CM | POA: Insufficient documentation

## 2023-10-12 DIAGNOSIS — N1831 Chronic kidney disease, stage 3a: Secondary | ICD-10-CM

## 2023-10-12 DIAGNOSIS — Z7901 Long term (current) use of anticoagulants: Secondary | ICD-10-CM | POA: Diagnosis not present

## 2023-10-12 DIAGNOSIS — E782 Mixed hyperlipidemia: Secondary | ICD-10-CM | POA: Diagnosis not present

## 2023-10-12 DIAGNOSIS — Z87891 Personal history of nicotine dependence: Secondary | ICD-10-CM | POA: Diagnosis not present

## 2023-10-12 DIAGNOSIS — I10 Essential (primary) hypertension: Secondary | ICD-10-CM

## 2023-10-12 LAB — CBC WITH DIFFERENTIAL (CANCER CENTER ONLY)
Abs Immature Granulocytes: 0.03 10*3/uL (ref 0.00–0.07)
Basophils Absolute: 0 10*3/uL (ref 0.0–0.1)
Basophils Relative: 0 %
Eosinophils Absolute: 0.2 10*3/uL (ref 0.0–0.5)
Eosinophils Relative: 3 %
HCT: 41.3 % (ref 39.0–52.0)
Hemoglobin: 13.4 g/dL (ref 13.0–17.0)
Immature Granulocytes: 0 %
Lymphocytes Relative: 25 %
Lymphs Abs: 1.9 10*3/uL (ref 0.7–4.0)
MCH: 29.6 pg (ref 26.0–34.0)
MCHC: 32.4 g/dL (ref 30.0–36.0)
MCV: 91.2 fL (ref 80.0–100.0)
Monocytes Absolute: 0.6 10*3/uL (ref 0.1–1.0)
Monocytes Relative: 8 %
Neutro Abs: 4.9 10*3/uL (ref 1.7–7.7)
Neutrophils Relative %: 64 %
Platelet Count: 180 10*3/uL (ref 150–400)
RBC: 4.53 MIL/uL (ref 4.22–5.81)
RDW: 13.6 % (ref 11.5–15.5)
WBC Count: 7.6 10*3/uL (ref 4.0–10.5)
nRBC: 0 % (ref 0.0–0.2)

## 2023-10-12 LAB — LIPID PANEL
Cholesterol: 76 mg/dL (ref 0–200)
HDL: 28.8 mg/dL — ABNORMAL LOW (ref >39.00)
LDL Cholesterol: 27 mg/dL (ref 0–99)
NonHDL: 46.83
Total CHOL/HDL Ratio: 3
Triglycerides: 99 mg/dL (ref 0.0–149.0)
VLDL: 19.8 mg/dL (ref 0.0–40.0)

## 2023-10-12 LAB — CMP (CANCER CENTER ONLY)
ALT: 11 U/L (ref 0–44)
AST: 11 U/L — ABNORMAL LOW (ref 15–41)
Albumin: 4.5 g/dL (ref 3.5–5.0)
Alkaline Phosphatase: 50 U/L (ref 38–126)
Anion gap: 7 (ref 5–15)
BUN: 36 mg/dL — ABNORMAL HIGH (ref 8–23)
CO2: 26 mmol/L (ref 22–32)
Calcium: 9.1 mg/dL (ref 8.9–10.3)
Chloride: 108 mmol/L (ref 98–111)
Creatinine: 2.44 mg/dL — ABNORMAL HIGH (ref 0.61–1.24)
GFR, Estimated: 27 mL/min — ABNORMAL LOW (ref >60)
Glucose, Bld: 106 mg/dL — ABNORMAL HIGH (ref 70–99)
Potassium: 4.8 mmol/L (ref 3.5–5.1)
Sodium: 141 mmol/L (ref 135–145)
Total Bilirubin: 0.4 mg/dL (ref 0.3–1.2)
Total Protein: 6.6 g/dL (ref 6.5–8.1)

## 2023-10-12 LAB — LACTATE DEHYDROGENASE: LDH: 126 U/L (ref 98–192)

## 2023-10-12 LAB — D-DIMER, QUANTITATIVE: D-Dimer, Quant: 0.48 ug{FEU}/mL (ref 0.00–0.50)

## 2023-10-12 LAB — LIPASE: Lipase: 47 U/L (ref 11.0–59.0)

## 2023-10-12 MED ORDER — FLUTICASONE PROPIONATE 50 MCG/ACT NA SUSP
2.0000 | Freq: Every day | NASAL | 1 refills | Status: DC
Start: 2023-10-12 — End: 2024-08-01

## 2023-10-12 MED ORDER — BENZONATATE 100 MG PO CAPS: 100.0000 mg | ORAL_CAPSULE | Freq: Three times a day (TID) | ORAL | 0 refills | Status: DC | PRN

## 2023-10-12 MED ORDER — APIXABAN 2.5 MG PO TABS: 2.5000 mg | ORAL_TABLET | Freq: Two times a day (BID) | ORAL | 5 refills | Status: DC

## 2023-10-12 MED ORDER — LEVOCETIRIZINE DIHYDROCHLORIDE 5 MG PO TABS
5.0000 mg | ORAL_TABLET | Freq: Every evening | ORAL | 0 refills | Status: DC
Start: 2023-10-12 — End: 2024-08-01

## 2023-10-12 NOTE — Progress Notes (Signed)
Hematology and Oncology Follow Up Visit  Craig Baldwin [id number] 1951-10-30 72 y.o. 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   Today he reports that he is doing well. He has no concerns.   He continues to take his Eliquis as directed. There has been no bleeding to his knowledge: denies epistaxis, gingivitis, hemoptysis, hematemesis, hematuria, melena, excessive bruising, blood donation.    No issues with infection. No fever, chills, n/v, cough, rash, SOB, chest pain, palpitations, abdominal pain or changes in bowel or bladder habits.  He notes occasional dizziness after bending forward and standing back up. No falls or syncope.  No swelling in his extremities.  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  Appetite and hydration are good.  Wt Readings from Last 3 Encounters:  10/12/23 229 lb (103.9 kg)  09/28/23 230 lb 6.4 oz (104.5 kg)  07/29/23 229 lb (103.9 kg)   ECOG Performance Status: 0 - Asymptomatic  Medications:  Allergies as of 10/12/2023       Reactions   Amoxicillin Itching        Medication List        Accurate as of October 12, 2023  9:58 AM. If you have any questions, ask your nurse or doctor.          apixaban 2.5 MG Tabs tablet Commonly known as: Eliquis Take 1 tablet (2.5 mg total) by mouth 2 (two) times daily.   benazepril 10 MG tablet Commonly known as: LOTENSIN TAKE 1 TABLET BY MOUTH ONCE DAILY .   cholecalciferol 25 MCG (1000 UNIT) tablet Commonly known as: VITAMIN D3 Take 1,000 Units by mouth daily.   empagliflozin 25 MG Tabs tablet Commonly known as: Jardiance Take 1 tablet (25 mg total) by mouth daily before breakfast.   glimepiride 1 MG tablet Commonly known as: AMARYL Take 1 tablet (1 mg total) by mouth daily with  breakfast.   Ozempic (0.25 or 0.5 MG/DOSE) 2 MG/1.5ML Sopn Generic drug: Semaglutide(0.25 or 0.5MG /DOS) Inject 0.5 mg into the skin once a week.   rosuvastatin 20 MG tablet Commonly known as: CRESTOR Take 1 tablet (20 mg total) by mouth daily.   tamsulosin 0.4 MG Caps capsule Commonly known as: FLOMAX TAKE 1 CAPSULE BY MOUTH ONCE DAILY AFTER SUPPER   triamcinolone ointment 0.1 % Commonly known as: KENALOG Apply topically 2 (two) times daily as needed.        Allergies:  Allergies  Allergen Reactions   Amoxicillin Itching    Past Medical History, Surgical history, Social history, and Family History were reviewed and updated.  Review of Systems: All other 10 point review of systems is negative.   Physical Exam:  vitals were not taken for this visit.   Wt Readings from Last 3 Encounters:  09/28/23 230 lb 6.4 oz (104.5 kg)  07/29/23 229 lb (103.9 kg)  05/14/23 236 lb (107 kg)    Ocular: Sclerae unicteric, pupils equal, round and reactive to light Ear-nose-throat: Oropharynx clear, dentition fair Lymphatic: No cervical or supraclavicular adenopathy Lungs no rales or rhonchi, good excursion bilaterally Heart regular rate and rhythm, no murmur appreciated Abd soft, nontender, positive bowel sounds MSK no focal spinal tenderness, no joint edema Neuro: non-focal, well-oriented, appropriate affect Breasts: Deferred   Lab  Results  Component Value Date   WBC 7.2 04/15/2023   HGB 13.2 04/15/2023   HCT 39.5 04/15/2023   MCV 89.2 04/15/2023   PLT 187 04/15/2023   No results found for: "FERRITIN", "IRON", "TIBC", "UIBC", "IRONPCTSAT" Lab Results  Component Value Date   RBC 4.43 04/15/2023   No results found for: "KPAFRELGTCHN", "LAMBDASER", "KAPLAMBRATIO" No results found for: "IGGSERUM", "IGA", "IGMSERUM" No results found for: "TOTALPROTELP", "ALBUMINELP", "A1GS", "A2GS", "BETS", "BETA2SER", "GAMS", "MSPIKE", "SPEI"   Chemistry      Component Value Date/Time    NA 134 (L) 04/15/2023 0829   NA 135 10/08/2021 0852   K 5.0 04/15/2023 0829   CL 107 04/15/2023 0829   CO2 23 04/15/2023 0829   BUN 39 (H) 04/15/2023 0829   BUN 23 10/08/2021 0852   CREATININE 2.57 (H) 04/15/2023 0829   CREATININE 2.39 (H) 06/01/2020 1544      Component Value Date/Time   CALCIUM 9.3 04/15/2023 0829   ALKPHOS 51 04/15/2023 0829   AST 12 (L) 04/15/2023 0829   ALT 14 04/15/2023 0829   BILITOT 0.4 04/15/2023 0829      Encounter Diagnoses  Name Primary?   Acute deep vein thrombosis (DVT) of popliteal vein of right lower extremity (HCC)    Factor V Leiden (HCC)    Primary hypercoagulable state (HCC) Yes    Impression and Plan: Craig Baldwin is a very pleasant 72 yo caucasian gentleman with chronic DVT in the right popliteal vein. 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    He will continue his same regimen with Eliquis 2.5 mg PO BID.    RTC 6 months APP, labs (CBC, CMP)  Rushie Chestnut, PA-C 10/21/20249:58 AM

## 2023-10-12 NOTE — Patient Instructions (Addendum)
1. Type 2 diabetes mellitus with hyperglycemia, without long-term current use of insulin (HCC) -A1c improved. Continue current med regimen endocrinoloigst has prescribed.  2. Essential hypertension -bp controlled. Continue benazapril 10 mg daily  3. Mixed hyperlipidemia -continue crestor and follow lab. - Lipid panel  4. Stage 3a chronic kidney disease (HCC) -follow labs and keep appt with nephrologist.  5. Allergic rhinitis, unspecified seasonality, unspecified trigger -xyzal 5 mg 1 tab po q day. Flonase nasal spray. Benzonatate for cough. -if any fever, chills sweat or body aches do covid test. If + let me know.   6. Acute vomiting episode after eating none since Saturday. Possible early food poising. Now resolved and no further signs/symptoms. (Cbc and cmp order by hematologist today)  Follow up date to be determined after lab review.

## 2023-10-12 NOTE — Progress Notes (Signed)
Subjective:    Patient ID: Craig Baldwin, male    DOB: Jul 28, 1951, 72 y.o.   MRN: [id number]  HPI  Pt in for follow up. Last AVS in April 2024 below.  "1. Type 2 diabetes mellitus with hyperglycemia, without long-term current use of insulin Continue jardiance and glipizide. A1c is high when insurance came out to check was 10.2. please get back in with Dr. Lonzo Cloud as you are overdue for visit. You can discuss use of ozempic as add on.   2. Hyperlipidemia associated with type 2 diabetes mellitus Continue crestor.   3. Stage 3a chronic kidney disease Cmp done today thru hemaolgy office. Low gfr. Pt has seen nephrolgist in past[place]   4. Htn- bp conrolled and refilled benazapril 10 mg daily. 90 tab. 3 refills."  Pt states is being followed by endocrinologist. Pt A1c in August 2024  was 6.6 pt states since last visit lost 4 lbs.  Pt states still on jardiance and glipizide.  Pt had high cholesterol. Will get lipid panel here today. He is fasting. On crestor 10 mg daily.  Pt has ckd. Pt still seeing nephrologist.  Pt just yesterday started to cough. No reflux symptoms. Nose is just now starting to run.  Mild nasal congestion. This happens typically in fall. No fever, no chills, no sweats or body aches.   Pt does not some  vomiting on Friday after eating some left overs in fridge. Vomited 5 times then symptoms. Then symptoms resolved. Never had diarreah.   Review of Systems See hpi.    Objective:   Physical Exam   General Mental Status- Alert. General Appearance- Not in acute distress.   Skin General: Color- Normal Color. Moisture- Normal Moisture.  Neck Carotid Arteries- Normal color. Moisture- Normal Moisture. No carotid bruits. No JVD.  Chest and Lung Exam Auscultation: Breath Sounds:-Normal.  Cardiovascular Auscultation:Rythm- Regular. Murmurs & Other Heart Sounds:Auscultation of the heart reveals- No  Murmurs.  Abdomen Inspection:-Inspeection Normal. Palpation/Percussion:Note:No mass. Palpation and Percussion of the abdomen reveal- Non Tender, Non Distended + BS, no rebound or guarding.   Neurologic Cranial Nerve exam:- CN III-XII intact(No nystagmus), symmetric smile. Strength:- 5/5 equal and symmetric strength both upper and lower extremities.    Heent- + pnd. Boggy turbinates. No sinus pressure on palpation. Posterior pharynx is normal.       Assessment & Plan:   Patient Instructions  1. Type 2 diabetes mellitus with hyperglycemia, without long-term current use of insulin (HCC) -A1c improved. Continue current med regimen endocrinoloigst has prescribed.  2. Essential hypertension -bp controlled. Continue benazapril 10 mg daily  3. Mixed hyperlipidemia -continue crestor and follow lab. - Lipid panel  4. Stage 3a chronic kidney disease (HCC) -follow labs and keep appt with nephrologist.  5. Allergic rhinitis, unspecified seasonality, unspecified trigger -xyzal 5 mg 1 tab po q day. Flonase nasal spray. Benzonatate for cough. -if any fever, chills sweat or body aches do covid test. If + let me know.   6. Acute vomiting episode after eating none since Saturday. Possible early food poising. Now resolved and no further signs/symptoms.    Follow up date to be determined after lab review.

## 2023-10-16 ENCOUNTER — Other Ambulatory Visit: Payer: Self-pay | Admitting: Cardiology

## 2023-10-22 ENCOUNTER — Ambulatory Visit: Payer: Medicare HMO | Admitting: Internal Medicine

## 2023-10-26 ENCOUNTER — Ambulatory Visit (HOSPITAL_COMMUNITY): Payer: PPO | Attending: Physician Assistant

## 2023-10-26 DIAGNOSIS — N189 Chronic kidney disease, unspecified: Secondary | ICD-10-CM | POA: Diagnosis not present

## 2023-10-26 DIAGNOSIS — E1122 Type 2 diabetes mellitus with diabetic chronic kidney disease: Secondary | ICD-10-CM | POA: Insufficient documentation

## 2023-10-26 DIAGNOSIS — R079 Chest pain, unspecified: Secondary | ICD-10-CM | POA: Diagnosis not present

## 2023-10-26 DIAGNOSIS — I2510 Atherosclerotic heart disease of native coronary artery without angina pectoris: Secondary | ICD-10-CM | POA: Diagnosis not present

## 2023-10-26 DIAGNOSIS — D6851 Activated protein C resistance: Secondary | ICD-10-CM | POA: Diagnosis not present

## 2023-10-26 DIAGNOSIS — I129 Hypertensive chronic kidney disease with stage 1 through stage 4 chronic kidney disease, or unspecified chronic kidney disease: Secondary | ICD-10-CM | POA: Diagnosis not present

## 2023-10-26 DIAGNOSIS — E785 Hyperlipidemia, unspecified: Secondary | ICD-10-CM | POA: Diagnosis not present

## 2023-10-26 DIAGNOSIS — I77810 Thoracic aortic ectasia: Secondary | ICD-10-CM | POA: Diagnosis not present

## 2023-10-26 LAB — ECHOCARDIOGRAM COMPLETE
Area-P 1/2: 3.37 cm2
S' Lateral: 2.4 cm

## 2023-11-02 DIAGNOSIS — E669 Obesity, unspecified: Secondary | ICD-10-CM | POA: Diagnosis not present

## 2023-11-02 DIAGNOSIS — E785 Hyperlipidemia, unspecified: Secondary | ICD-10-CM | POA: Diagnosis not present

## 2023-11-02 DIAGNOSIS — N183 Chronic kidney disease, stage 3 unspecified: Secondary | ICD-10-CM | POA: Diagnosis not present

## 2023-11-02 DIAGNOSIS — I10 Essential (primary) hypertension: Secondary | ICD-10-CM | POA: Diagnosis not present

## 2023-11-02 DIAGNOSIS — E1169 Type 2 diabetes mellitus with other specified complication: Secondary | ICD-10-CM | POA: Diagnosis not present

## 2023-11-03 DIAGNOSIS — R300 Dysuria: Secondary | ICD-10-CM | POA: Diagnosis not present

## 2023-11-03 DIAGNOSIS — R809 Proteinuria, unspecified: Secondary | ICD-10-CM | POA: Diagnosis not present

## 2023-11-22 ENCOUNTER — Other Ambulatory Visit: Payer: Self-pay | Admitting: Medical

## 2023-11-26 ENCOUNTER — Telehealth: Payer: Self-pay

## 2023-11-26 NOTE — Telephone Encounter (Signed)
Pt stated that he called the drug store and they are refilling what he needed.

## 2023-11-26 NOTE — Telephone Encounter (Signed)
Caller Name Harroll Smilowitz Caller Phone Number [phone] Patient Name Craig Baldwin Patient DOB 27-May-1951 Call Type Message Only Information Provided Reason for Call Request for General Office Information Initial Comment Caller states he is currently out of medication and pt is not having any symptoms. Pt wants to leave a message to let the doctor know pt is out of medication. Additional Comment Office hours provided. Disp. Time Disposition Final User 11/26/2023 7:01:30 AM General Information Provided Yes Charlesetta Shanks Call Closed By: Charlesetta Shanks Transaction Date/Time: 11/26/2023 6:58:14 AM (ET)

## 2024-01-11 LAB — HM DIABETES EYE EXAM

## 2024-02-01 ENCOUNTER — Encounter: Payer: Self-pay | Admitting: Internal Medicine

## 2024-02-01 ENCOUNTER — Ambulatory Visit (INDEPENDENT_AMBULATORY_CARE_PROVIDER_SITE_OTHER): Payer: PPO | Admitting: Internal Medicine

## 2024-02-01 VITALS — BP 122/78 | HR 64 | Ht 71.0 in | Wt 227.0 lb

## 2024-02-01 DIAGNOSIS — Z7985 Long-term (current) use of injectable non-insulin antidiabetic drugs: Secondary | ICD-10-CM | POA: Diagnosis not present

## 2024-02-01 DIAGNOSIS — E1122 Type 2 diabetes mellitus with diabetic chronic kidney disease: Secondary | ICD-10-CM | POA: Diagnosis not present

## 2024-02-01 DIAGNOSIS — Z7984 Long term (current) use of oral hypoglycemic drugs: Secondary | ICD-10-CM

## 2024-02-01 DIAGNOSIS — N184 Chronic kidney disease, stage 4 (severe): Secondary | ICD-10-CM

## 2024-02-01 DIAGNOSIS — E1142 Type 2 diabetes mellitus with diabetic polyneuropathy: Secondary | ICD-10-CM | POA: Diagnosis not present

## 2024-02-01 LAB — POCT GLUCOSE (DEVICE FOR HOME USE): Glucose Fasting, POC: 142 mg/dL — AB (ref 70–99)

## 2024-02-01 LAB — POCT GLYCOSYLATED HEMOGLOBIN (HGB A1C): Hemoglobin A1C: 6.2 % — AB (ref 4.0–5.6)

## 2024-02-01 MED ORDER — SEMAGLUTIDE (1 MG/DOSE) 4 MG/3ML ~~LOC~~ SOPN: 1.0000 mg | PEN_INJECTOR | SUBCUTANEOUS | Status: AC

## 2024-02-01 NOTE — Progress Notes (Signed)
 Name: Craig Baldwin  MRN/ DOB: [id number], 21-Aug-1951   Age/ Sex: 73 y.o., male    PCP: Francine Iron   Reason for Endocrinology Evaluation: Type 2 Diabetes Mellitus     Date of Initial Endocrinology Visit: 02/04/2022    PATIENT IDENTIFIER: Craig Baldwin is a 73 y.o. male with a past medical history of T2DM, CKD, dyslipidemia, obesity and OSA, hx of DVT. The patient presented for initial endocrinology clinic visit on 02/04/2022 for consultative assistance with his diabetes management.    HPI: [name] was    Diagnosed with DM years ago  Prior Medications tried/Intolerance: Glimepiride  started 12/2021. Actos, Metformin.  Hemoglobin A1c has ranged from 6.4% in 2021, peaking at 10.4 %in 2022.   On his initial visit to our clinic his A1c 6.9 % , down from 10.4% . We continued Glimepiride  and switched INvokana  to Jardiance    Did not qualify for patient assistance for Jardiance  2024 Started Ozempic  04/2023, was approved for patient assistance 05/2023  SUBJECTIVE:   During the last visit (07/29/2023): A1c 6.6 %     Today (02/01/24): Mr. [name] is here for a follow up on diabetes management. He  checks his blood sugars 0 times daily . 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   Denies nausea, vomiting  Has noted occasional changes in bowel movements feels bloated    HOME DIABETES REGIMEN: Ozempic  0.5 mg weekly Jardiance  25 mg daily  Glimepiride  1 mg daily    Statin: yes ACE-I/ARB: yes Prior Diabetic Education: Yes, saw Rice Chamorro 12/2021   METER DOWNLOAD SUMMARY: did not bring    DIABETIC COMPLICATIONS: Microvascular complications:  CKD, neuropathy Denies: retinopathy Last eye exam: Completed does not recall   Macrovascular  complications:   Denies: CAD, PVD, CVA   PAST HISTORY: Past Medical History:  Past Medical History:  Diagnosis Date   Chronic kidney disease    Diabetes mellitus without complication (HCC)    Hyperlipidemia    Hypertension    Past Surgical History: No past surgical history on file.  Social History:  reports that he quit smoking about 27 years ago. His smoking use included cigarettes. He quit smokeless tobacco use about 6 years ago.  His smokeless tobacco use included chew. He reports that he does not currently use alcohol. He reports that he does not use drugs. Family History: No family history on file.   HOME MEDICATIONS: Allergies as of 02/01/2024       Reactions   Amoxicillin Itching        Medication List        Accurate as of February 01, 2024  9:28 AM. If you have any questions, ask your nurse or doctor.          apixaban  2.5 MG Tabs tablet Commonly known as: Eliquis  Take 1 tablet (2.5 mg total) by mouth 2 (two) times daily.   benazepril  10 MG tablet Commonly known as: LOTENSIN  TAKE 1 TABLET BY MOUTH ONCE DAILY .   benzonatate  100 MG capsule Commonly known as: TESSALON  Take 1 capsule (100 mg total) by mouth 3 (three) times daily as needed for cough.   cholecalciferol 25 MCG (1000 UNIT) tablet Commonly known as: VITAMIN D3 Take 1,000 Units by mouth daily.   empagliflozin  25 MG Tabs tablet Commonly  known as: Jardiance  Take 1 tablet (25 mg total) by mouth daily before breakfast.   fluticasone  50 MCG/ACT nasal spray Commonly known as: FLONASE  Place 2 sprays into both nostrils daily.   glimepiride  1 MG tablet Commonly known as: AMARYL  Take 1 tablet (1 mg total) by mouth daily with breakfast.   levocetirizine 5 MG tablet Commonly known as: XYZAL  Take 1 tablet (5 mg total) by mouth every evening.   Ozempic  (0.25 or 0.5 MG/DOSE) 2 MG/1.5ML Sopn Generic drug: Semaglutide (0.25 or 0.5MG /DOS) Inject 0.5 mg into the skin once a week.   rosuvastatin  20  MG tablet Commonly known as: CRESTOR  Take 1 tablet by mouth once daily   tamsulosin  0.4 MG Caps capsule Commonly known as: FLOMAX  TAKE 1 CAPSULE BY MOUTH ONCE DAILY AFTER SUPPER   triamcinolone ointment 0.1 % Commonly known as: KENALOG Apply topically 2 (two) times daily as needed.         ALLERGIES: Allergies  Allergen Reactions   Amoxicillin Itching     REVIEW OF SYSTEMS: A comprehensive ROS was conducted with the patient and is negative except as per HPI    OBJECTIVE:   VITAL SIGNS: BP 122/78 (BP Location: Left Arm, Patient Position: Sitting, Cuff Size: Normal)   Pulse 64   Ht 5\' 11"  (1.803 m)   Wt 227 lb (103 kg)   SpO2 97%   BMI 31.66 kg/m    PHYSICAL EXAM:  General: Pt appears well and is in NAD  Chest: Clear with good BS bilat   Heart: RRR  Extremities:  Lower extremities - No pretibial edema  Neuro: MS is good with appropriate affect, pt is alert and Ox3    DM foot exam: 02/01/2024  The skin of the feet is intact without sores or ulcerations. The pedal pulses are 1+ on right and 1+ on left. The sensation is decreased to a screening 5.07, 10 gram monofilament bilaterally at the toes    DATA REVIEWED:  Lab Results  Component Value Date   HGBA1C 6.2 (A) 02/01/2024   HGBA1C 6.6 (A) 07/29/2023   HGBA1C 8.9 (A) 05/14/2023    Latest Reference Range & Units 10/12/23 11:11  Total CHOL/HDL Ratio  3  Cholesterol 0 - 200 mg/dL 76  HDL Cholesterol >16.10 mg/dL 96.04 (L)  LDL (calc) 0 - 99 mg/dL 27  NonHDL  54.09  Triglycerides 0.0 - 149.0 mg/dL 81.1  VLDL 0.0 - 91.4 mg/dL 78.2  (L): Data is abnormally low   In office BG 142 Mg/DL  ASSESSMENT / PLAN / RECOMMENDATIONS:   1) Type 2 Diabetes Mellitus, optimally controlled, With CKD IV and neuropathic  complications - Most recent A1c of 6.6 %. Goal A1c < 7.0 %.    -A1c is optimal, will discontinue glimepiride  to prevent hypoglycemia -He is getting Ozempic  through patient assistance, he did not  qualify to get Jardiance  through patient assistance, he is getting it through the pharmacy -Will increase Ozempic   MEDICATIONS: Increase Ozempic  1 mg weekly Continue Jardiance  25 mg, 1 tablet  daily  Stop glimepiride  1 mg, 1 tablet before Breakfast   EDUCATION / INSTRUCTIONS: BG monitoring instructions: Patient does't trust the validity of glucose meter  Call [facility] if: BG persistently < 70 I reviewed the Rule of 15 for the treatment of hypoglycemia in detail with the patient. Literature supplied.   2) Diabetic complications:  Eye: Does not have known diabetic retinopathy.  Neuro/ Feet: Does  have known diabetic peripheral neuropathy. Renal: Patient does  have  known baseline CKD. Follows with Dr. Adegotoye with [facility]   3) Dyslipidemia :  -LDL has been at goal, patient is on rosuvastatin  20 mg daily    F/U in 6 months    Signed electronically by: Natale Bail, MD  [facility]  [facility] [address] [place] [place], [place] [address] Phone: [phone] FAX: [phone]   CC: Francine Iron [address] [place] [address] Phone: [phone]  Fax: [phone]    Return to Endocrinology clinic as below: Future Appointments  Date Time Provider Department Center  02/01/2024  9:30 AM Tysean V[place], Julian Obey, MD [id number] None

## 2024-02-01 NOTE — Patient Instructions (Addendum)
 Increase Ozempic  1 mg weekly  Continue  Jardiance  25 mg daily  STOP Glimepiride  1 mg, 1 tablet before Breakfast      HOW TO TREAT LOW BLOOD SUGARS (Blood sugar LESS THAN 70 MG/DL) Please follow the RULE OF 15 for the treatment of hypoglycemia treatment (when your (blood sugars are less than 70 mg/dL)   STEP 1: Take 15 grams of carbohydrates when your blood sugar is low, which includes:  3-4 GLUCOSE TABS  OR 3-4 OZ OF JUICE OR REGULAR SODA OR ONE TUBE OF GLUCOSE GEL    STEP 2: RECHECK blood sugar in 15 MINUTES STEP 3: If your blood sugar is still low at the 15 minute recheck --> then, go back to STEP 1 and treat AGAIN with another 15 grams of carbohydrates.

## 2024-02-21 ENCOUNTER — Other Ambulatory Visit: Payer: Self-pay | Admitting: Medical

## 2024-03-22 ENCOUNTER — Other Ambulatory Visit: Payer: Self-pay | Admitting: Family

## 2024-03-22 ENCOUNTER — Telehealth: Payer: Self-pay

## 2024-03-22 DIAGNOSIS — I82431 Acute embolism and thrombosis of right popliteal vein: Secondary | ICD-10-CM

## 2024-03-22 DIAGNOSIS — M79604 Pain in right leg: Secondary | ICD-10-CM

## 2024-03-22 NOTE — Telephone Encounter (Signed)
 Patient called stating his right upper leg was sore, no redness and swelling. Currently taking eliquis BID. Discussed with Sarah,NP. Order in for [place]. Patient to schedule and go to ED if it worsens before [place] appt.

## 2024-03-23 ENCOUNTER — Ambulatory Visit (HOSPITAL_BASED_OUTPATIENT_CLINIC_OR_DEPARTMENT_OTHER)
Admission: RE | Admit: 2024-03-23 | Discharge: 2024-03-23 | Disposition: A | Discharge status: Home / Self Care | Source: Ambulatory Visit | Attending: Family | Admitting: Family

## 2024-03-23 DIAGNOSIS — I82431 Acute embolism and thrombosis of right popliteal vein: Secondary | ICD-10-CM | POA: Insufficient documentation

## 2024-03-23 DIAGNOSIS — M79604 Pain in right leg: Secondary | ICD-10-CM | POA: Insufficient documentation

## 2024-03-23 DIAGNOSIS — I82531 Chronic embolism and thrombosis of right popliteal vein: Secondary | ICD-10-CM | POA: Diagnosis not present

## 2024-03-25 ENCOUNTER — Encounter: Payer: Self-pay | Admitting: *Deleted

## 2024-04-11 ENCOUNTER — Ambulatory Visit (INDEPENDENT_AMBULATORY_CARE_PROVIDER_SITE_OTHER): Payer: PPO | Admitting: Medical

## 2024-04-11 VITALS — BP 112/62 | HR 65 | Resp 18 | Ht 71.0 in | Wt 221.0 lb

## 2024-04-11 DIAGNOSIS — I10 Essential (primary) hypertension: Secondary | ICD-10-CM | POA: Diagnosis not present

## 2024-04-11 DIAGNOSIS — N1831 Chronic kidney disease, stage 3a: Secondary | ICD-10-CM | POA: Diagnosis not present

## 2024-04-11 DIAGNOSIS — E1165 Type 2 diabetes mellitus with hyperglycemia: Secondary | ICD-10-CM

## 2024-04-11 DIAGNOSIS — E782 Mixed hyperlipidemia: Secondary | ICD-10-CM | POA: Diagnosis not present

## 2024-04-11 DIAGNOSIS — Z7985 Long-term (current) use of injectable non-insulin antidiabetic drugs: Secondary | ICD-10-CM

## 2024-04-11 LAB — MICROALBUMIN / CREATININE URINE RATIO
Creatinine,U: 86.5 mg/dL
Microalb Creat Ratio: 8.8 mg/g (ref 0.0–30.0)
Microalb, Ur: 0.8 mg/dL (ref 0.0–1.9)

## 2024-04-11 LAB — COMPREHENSIVE METABOLIC PANEL WITH GFR
ALT: 13 U/L (ref 0–53)
AST: 12 U/L (ref 0–37)
Albumin: 4.6 g/dL (ref 3.5–5.2)
Alkaline Phosphatase: 55 U/L (ref 39–117)
BUN: 31 mg/dL — ABNORMAL HIGH (ref 6–23)
CO2: 25 meq/L (ref 19–32)
Calcium: 9.4 mg/dL (ref 8.4–10.5)
Chloride: 105 meq/L (ref 96–112)
Creatinine, Ser: 2.41 mg/dL — ABNORMAL HIGH (ref 0.40–1.50)
GFR: 26.13 mL/min — ABNORMAL LOW (ref >60.00)
Glucose, Bld: 123 mg/dL — ABNORMAL HIGH (ref 70–99)
Potassium: 5 meq/L (ref 3.5–5.1)
Sodium: 138 meq/L (ref 135–145)
Total Bilirubin: 0.6 mg/dL (ref 0.2–1.2)
Total Protein: 7 g/dL (ref 6.0–8.3)

## 2024-04-11 LAB — LIPID PANEL
Cholesterol: 86 mg/dL (ref 0–200)
HDL: 27.8 mg/dL — ABNORMAL LOW (ref >39.00)
LDL Cholesterol: 39 mg/dL (ref 0–99)
NonHDL: 58.45
Total CHOL/HDL Ratio: 3
Triglycerides: 95 mg/dL (ref 0.0–149.0)
VLDL: 19 mg/dL (ref 0.0–40.0)

## 2024-04-11 NOTE — Patient Instructions (Signed)
 Type 2 Diabetes Mellitus Improved glycemic control with A1c at 6.2. Weight loss of 9 pounds noted with ozempic . Endocrinologist plans to increase Ozempic  to 1 mg weekly. - Continue Jardiance  25 mg daily. - Continue Ozempic  0.5 mg weekly until new dose available. - Monitor blood glucose levels regularly. - Order metabolic panel.  Chronic Kidney Disease GFR remains in the 20s. Under nephrology care. Advised to avoid NSAIDs due to nephrotoxicity and Eliquis  interaction. - Avoid NSAIDs. - Use Tylenol for pain management. - Continue follow-up with nephrologist. - Order urine microalbumin test.  Hypertension Well-controlled with Lotensin  10 mg daily. Current BP 112/62 mmHg. - Continue Lotensin  10 mg daily.  Hyperlipidemia Well-controlled with Crestor  20 mg daily. Lipid panel within normal limits. - Continue Crestor  20 mg daily. - Order lipid panel.  General Health Maintenance Vaccination status for PCV20 and Shingrix needs verification. Records not updated in clinic system. - Check with pharmacy for records of PCV20 and Shingrix vaccinations. - Consider administering PCV20 and Shingrix if not previously given. - Consider Tdap vaccination as well thru pharmacy - Ensure pharmacy updates vaccination records.  Follow-up Routine follow-up necessary for chronic condition monitoring and treatment adjustment. Coordination with local pharmacy for vaccinations recommended. - Follow up in 6 months or sooner if needed. - Update on results of metabolic panel, kidney function test, lipid panel, and urine microalbumin test

## 2024-04-11 NOTE — Progress Notes (Signed)
 Subjective:    Patient ID: Craig Baldwin, male    DOB: 02/24/1951, 73 y.o.   MRN: [id number]  HPI  Discussed the use of AI scribe software for clinical note transcription with the patient, who gave verbal consent to proceed.  History of Present Illness   Craig Baldwin is a 73 year old male with diabetes, high cholesterol, and chronic kidney disease who presents for routine follow-up.  He has diabetes and is currently on Jardiance  25 mg daily and Ozempic  0.5 mg weekly. His last A1c, checked two months ago, was 6.2, an improvement from previous levels. He has lost approximately 9 pounds, now weighing 221 pounds, down from an average of 230 pounds.  His high cholesterol is well-controlled with Crestor  20 mg daily, and his cholesterol levels have not been elevated recently.  He has chronic kidney disease with a GFR in the 20 range since 2023 and sees a nephrologist regularly. He avoids NSAIDs and only takes Tylenol for pain, although he is not currently taking any pain medication.  He is on Lotensin  (benazepril ) 10 mg daily for blood pressure management, which is well-controlled at 112/62 mmHg.  He has a history of a blood clot in his leg. On eliquis .  He recalls receiving vaccines at [facility], including a pneumonia vaccine and possibly the Shingrix vaccine, but is unsure of the exact timing. He uses [facility] as his pharmacy.  He lives about 30 minutes away from the clinic and works part-time as a Naval architect, traveling up to 010 miles.         Review of Systems  Constitutional:  Negative for chills, fatigue and fever.  HENT:  Negative for dental problem.   Respiratory:  Negative for cough, choking, shortness of breath and wheezing.   Cardiovascular:  Negative for chest pain and palpitations.  Gastrointestinal:  Negative for abdominal pain.  Genitourinary:  Negative for dysuria, frequency and hematuria.  Musculoskeletal:  Negative for back pain.  Skin:  Negative for rash.   Neurological:  Negative for facial asymmetry, speech difficulty, weakness and light-headedness.  Hematological:  Negative for adenopathy. Does not bruise/bleed easily.  Psychiatric/Behavioral:  Negative for behavioral problems, confusion, decreased concentration and dysphoric mood.     Past Medical History:  Diagnosis Date   Chronic kidney disease    Diabetes mellitus without complication (HCC)    Hyperlipidemia    Hypertension      Social History   Socioeconomic History   Marital status: Married    Spouse name: Not on file   Number of children: Not on file   Years of education: Not on file   Highest education level: 9th grade  Occupational History   Not on file  Tobacco Use   Smoking status: Former    Current packs/day: 0.00    Types: Cigarettes    Quit date: 12/26/1996    Years since quitting: 27.3   Smokeless tobacco: Former    Types: Chew    Quit date: 12/28/2017  Vaping Use   Vaping status: Never Used  Substance and Sexual Activity   Alcohol use: Not Currently    Comment: Occasional   Drug use: Never   Sexual activity: Not on file  Other Topics Concern   Not on file  Social History Narrative   Not on file   Social Drivers of Health   Financial Resource Strain: Low Risk  (04/04/2024)   Overall Financial Resource Strain (CARDIA)    Difficulty of Paying Living Expenses: Not hard at all  Food  Insecurity: No Food Insecurity (04/04/2024)   Hunger Vital Sign    Worried About Running Out of Food in the Last Year: Never true    Ran Out of Food in the Last Year: Never true  Transportation Needs: No Transportation Needs (04/04/2024)   PRAPARE - Administrator, Civil Service (Medical): No    Lack of Transportation (Non-Medical): No  Physical Activity: Insufficiently Active (04/04/2024)   Exercise Vital Sign    Days of Exercise per Week: 4 days    Minutes of Exercise per Session: 30 min  Stress: No Stress Concern Present (04/04/2024)   [employer] of  Occupational Health - Occupational Stress Questionnaire    Feeling of Stress : Not at all  Social Connections: Moderately Integrated (04/04/2024)   Social Connection and Isolation Panel [NHANES]    Frequency of Communication with Friends and Family: Twice a week    Frequency of Social Gatherings with Friends and Family: Twice a week    Attends Religious Services: More than 4 times per year    Active Member of [employer] or Organizations: No    Attends Engineer, structural: Not on file    Marital Status: Married  Intimate Partner Violence: Unknown (03/25/2022)   Received from [employer], [facility]   HITS    Physically Hurt: Not on file    Insult or Talk Down To: Not on file    Threaten Physical Harm: Not on file    Scream or Curse: Not on file    No past surgical history on file.  No family history on file.  Allergies  Allergen Reactions   Amoxicillin Itching    Current Outpatient Medications on File Prior to Visit  Medication Sig Dispense Refill   apixaban  (ELIQUIS ) 2.5 MG TABS tablet Take 1 tablet (2.5 mg total) by mouth 2 (two) times daily. 60 tablet 5   benazepril  (LOTENSIN ) 10 MG tablet TAKE 1 TABLET BY MOUTH ONCE DAILY . 90 tablet 3   benzonatate  (TESSALON ) 100 MG capsule Take 1 capsule (100 mg total) by mouth 3 (three) times daily as needed for cough. (Patient not taking: Reported on 02/01/2024) 30 capsule 0   cholecalciferol (VITAMIN D3) 25 MCG (1000 UNIT) tablet Take 1,000 Units by mouth daily.     empagliflozin  (JARDIANCE ) 25 MG TABS tablet Take 1 tablet (25 mg total) by mouth daily before breakfast. 30 tablet 11   fluticasone  (FLONASE ) 50 MCG/ACT nasal spray Place 2 sprays into both nostrils daily. 16 g 1   glimepiride  (AMARYL ) 1 MG tablet Take 1 tablet (1 mg total) by mouth daily with breakfast. 90 tablet 3   levocetirizine (XYZAL ) 5 MG tablet Take 1 tablet (5 mg total) by mouth every evening. 30 tablet 0   rosuvastatin  (CRESTOR ) 20 MG tablet Take 1 tablet by  mouth once daily 90 tablet 3   Semaglutide , 1 MG/DOSE, 4 MG/3ML SOPN Inject 1 mg as directed once a week.     tamsulosin  (FLOMAX ) 0.4 MG CAPS capsule TAKE 1 CAPSULE BY MOUTH ONCE DAILY AFTER SUPPER 90 capsule 0   triamcinolone ointment (KENALOG) 0.1 % Apply topically 2 (two) times daily as needed.     No current facility-administered medications on file prior to visit.    BP 112/62   Pulse 65   Resp 18   Ht 5\' 11"  (1.803 m)   Wt 221 lb (100.2 kg)   SpO2 95%   BMI 30.82 kg/m        Objective:  Physical Exam  General Mental Status- Alert. General Appearance- Not in acute distress.   Skin General: Color- Normal Color. Moisture- Normal Moisture.  Neck Carotid Arteries- Normal color. Moisture- Normal Moisture. No carotid bruits. No JVD.  Chest and Lung Exam Auscultation: Breath Sounds: CTA  Cardiovascular Auscultation:Rythm- RRR Murmurs & Other Heart Sounds:Auscultation of the heart reveals- No Murmurs.  Abdomen Inspection:-Inspeection Normal. Palpation/Percussion:Note:No mass. Palpation and Percussion of the abdomen reveal- Non Tender, Non Distended + BS, no rebound or guarding.    Neurologic Cranial Nerve exam:- CN III-XII intact(No nystagmus), symmetric smile. Strength:- 5/5 equal and symmetric strength both upper and lower extremities.   Lower ext- calfs symmetric, no pedal edema, negative homans signs.    Assessment & Plan:   Patient Instructions  Type 2 Diabetes Mellitus Improved glycemic control with A1c at 6.2. Weight loss of 9 pounds noted with ozempic . Endocrinologist plans to increase Ozempic  to 1 mg weekly. - Continue Jardiance  25 mg daily. - Continue Ozempic  0.5 mg weekly until new dose available. - Monitor blood glucose levels regularly. - Order metabolic panel.  Chronic Kidney Disease GFR remains in the 20s. Under nephrology care. Advised to avoid NSAIDs due to nephrotoxicity and Eliquis  interaction. - Avoid NSAIDs. - Use Tylenol for pain  management. - Continue follow-up with nephrologist. - Order urine microalbumin test.  Hypertension Well-controlled with Lotensin  10 mg daily. Current BP 112/62 mmHg. - Continue Lotensin  10 mg daily.  Hyperlipidemia Well-controlled with Crestor  20 mg daily. Lipid panel within normal limits. - Continue Crestor  20 mg daily. - Order lipid panel.  General Health Maintenance Vaccination status for PCV20 and Shingrix needs verification. Records not updated in clinic system. - Check with pharmacy for records of PCV20 and Shingrix vaccinations. - Consider administering PCV20 and Shingrix if not previously given. - Consider Tdap vaccination as well thru pharmacy - Ensure pharmacy updates vaccination records.  Follow-up Routine follow-up necessary for chronic condition monitoring and treatment adjustment. Coordination with local pharmacy for vaccinations recommended. - Follow up in 6 months or sooner if needed. - Update on results of metabolic panel, kidney function test, lipid panel, and urine microalbumin test    Sylvia Everts, PA-C

## 2024-04-14 ENCOUNTER — Encounter: Payer: Self-pay | Admitting: Medical

## 2024-04-25 ENCOUNTER — Other Ambulatory Visit: Payer: Self-pay | Admitting: Medical

## 2024-05-05 ENCOUNTER — Telehealth: Payer: Self-pay

## 2024-05-05 ENCOUNTER — Other Ambulatory Visit: Payer: Self-pay | Admitting: Medical

## 2024-05-05 NOTE — Telephone Encounter (Signed)
Patient picked up sample of Ozempic

## 2024-05-05 NOTE — Telephone Encounter (Signed)
 Pt called and left a vm stating that he is going to fill out a new application.   I called the patient back and advised to him that he could come get a sample so he could at least have something instead of nothing while he waits.    Sample  Medication:Ozempic  Dose: 0.25/0.5 mg Quantity:1 box ZOX:[id number] EXP:11/20/2026

## 2024-05-06 ENCOUNTER — Other Ambulatory Visit: Payer: Self-pay | Admitting: Medical

## 2024-05-09 DIAGNOSIS — R300 Dysuria: Secondary | ICD-10-CM | POA: Diagnosis not present

## 2024-05-09 DIAGNOSIS — I10 Essential (primary) hypertension: Secondary | ICD-10-CM | POA: Diagnosis not present

## 2024-05-09 DIAGNOSIS — R809 Proteinuria, unspecified: Secondary | ICD-10-CM | POA: Diagnosis not present

## 2024-05-09 DIAGNOSIS — N183 Chronic kidney disease, stage 3 unspecified: Secondary | ICD-10-CM | POA: Diagnosis not present

## 2024-05-09 DIAGNOSIS — E669 Obesity, unspecified: Secondary | ICD-10-CM | POA: Diagnosis not present

## 2024-05-09 DIAGNOSIS — E1169 Type 2 diabetes mellitus with other specified complication: Secondary | ICD-10-CM | POA: Diagnosis not present

## 2024-05-09 DIAGNOSIS — E785 Hyperlipidemia, unspecified: Secondary | ICD-10-CM | POA: Diagnosis not present

## 2024-05-10 DIAGNOSIS — R300 Dysuria: Secondary | ICD-10-CM | POA: Diagnosis not present

## 2024-05-10 DIAGNOSIS — R809 Proteinuria, unspecified: Secondary | ICD-10-CM | POA: Diagnosis not present

## 2024-05-19 ENCOUNTER — Other Ambulatory Visit: Payer: Self-pay | Admitting: Medical

## 2024-05-23 ENCOUNTER — Telehealth: Payer: Self-pay | Admitting: Internal Medicine

## 2024-05-23 NOTE — Telephone Encounter (Signed)
 Patient came in to office today and brought in a "novo Nordisk Patient Assistance Program Application" to be completed.  The application is in Dr. Eleonore Grill folder in the front office.

## 2024-05-25 ENCOUNTER — Telehealth: Payer: Self-pay

## 2024-05-25 NOTE — Telephone Encounter (Signed)
 Application has been completed and faxed

## 2024-06-22 ENCOUNTER — Telehealth: Payer: Self-pay

## 2024-06-22 MED ORDER — OZEMPIC (0.25 OR 0.5 MG/DOSE) 2 MG/3ML ~~LOC~~ SOPN: 0.5000 mg | PEN_INJECTOR | SUBCUTANEOUS | Status: DC

## 2024-06-22 NOTE — Telephone Encounter (Signed)
 Ozempic  sample has been labeled and place in fridge for patient to pickup. Patient will do the 0.5mg  dose until patient assistance comes.

## 2024-06-27 ENCOUNTER — Telehealth: Payer: Self-pay

## 2024-06-27 NOTE — Telephone Encounter (Signed)
 Advise patient that Novo Nordisk application was approved. Medication should ship in 10-14 days.

## 2024-06-27 NOTE — Telephone Encounter (Signed)
 Patient picked up sample and advised that patient assistance was approved.

## 2024-07-19 ENCOUNTER — Telehealth: Payer: Self-pay

## 2024-07-19 MED ORDER — SEMAGLUTIDE(0.25 OR 0.5MG/DOS) 2 MG/3ML ~~LOC~~ SOPN: 0.5000 mg | PEN_INJECTOR | SUBCUTANEOUS | Status: DC

## 2024-07-19 NOTE — Telephone Encounter (Signed)
Patient picked up Ozempic sample 

## 2024-07-19 NOTE — Telephone Encounter (Signed)
 Patient picked up sample of Ozempic  1 box until patient assistance arrives.

## 2024-07-20 NOTE — Telephone Encounter (Signed)
Patient came in to office today and picked up 4 boxes of patient assistance Ozempic.

## 2024-08-01 ENCOUNTER — Ambulatory Visit (INDEPENDENT_AMBULATORY_CARE_PROVIDER_SITE_OTHER): Payer: PPO | Admitting: Internal Medicine

## 2024-08-01 ENCOUNTER — Encounter: Payer: Self-pay | Admitting: Internal Medicine

## 2024-08-01 VITALS — BP 120/80 | HR 66 | Ht 71.0 in | Wt 219.0 lb

## 2024-08-01 DIAGNOSIS — Z7985 Long-term (current) use of injectable non-insulin antidiabetic drugs: Secondary | ICD-10-CM

## 2024-08-01 DIAGNOSIS — E1122 Type 2 diabetes mellitus with diabetic chronic kidney disease: Secondary | ICD-10-CM

## 2024-08-01 DIAGNOSIS — N184 Chronic kidney disease, stage 4 (severe): Secondary | ICD-10-CM

## 2024-08-01 DIAGNOSIS — Z7984 Long term (current) use of oral hypoglycemic drugs: Secondary | ICD-10-CM | POA: Diagnosis not present

## 2024-08-01 DIAGNOSIS — E1142 Type 2 diabetes mellitus with diabetic polyneuropathy: Secondary | ICD-10-CM

## 2024-08-01 LAB — POCT GLYCOSYLATED HEMOGLOBIN (HGB A1C): Hemoglobin A1C: 7 % — AB (ref 4.0–5.6)

## 2024-08-01 LAB — POCT GLUCOSE (DEVICE FOR HOME USE): Glucose Fasting, POC: 156 mg/dL — AB (ref 70–99)

## 2024-08-01 MED ORDER — EMPAGLIFLOZIN 25 MG PO TABS: 25.0000 mg | ORAL_TABLET | Freq: Every day | ORAL | 11 refills | Status: AC

## 2024-08-01 NOTE — Telephone Encounter (Signed)
 Copied from CRM [id number]. Topic: General - Other >> Aug 01, 2024 11:37 AM Deleta RAMAN wrote: Reason for CRM: patient rep called to schedule appointment she would like for a awv and physical to be completed on the same day as office visit 10/20. Notified that this wouldn't be possible due to visit type being a follow up. She mentioned for me to keep it as a follow up and note it as a physical or awv. Patient has not received any awv and she is concerned about that as well. Please contact the patient for any concerns regarding this matter.

## 2024-08-01 NOTE — Progress Notes (Signed)
 Name: Craig Baldwin  MRN/ DOB: [id number], 11-Mar-1951   Age/ Sex: 73 y.o., male    PCP: Dorina Dallas RIGGERS   Reason for Endocrinology Evaluation: Type 2 Diabetes Mellitus     Date of Initial Endocrinology Visit: 02/04/2022    PATIENT IDENTIFIER: Craig Baldwin is a 73 y.o. male with a past medical history of T2DM, CKD, dyslipidemia, obesity and OSA, hx of DVT. The patient presented for initial endocrinology clinic visit on 02/04/2022 for consultative assistance with his diabetes management.    HPI: Craig Baldwin was    Diagnosed with DM years ago  Prior Medications tried/Intolerance: Glimepiride  started 12/2021. Actos, Metformin.  Hemoglobin A1c has ranged from 6.4% in 2021, peaking at 10.4 %in 2022.   On his initial visit to our clinic his A1c 6.9 % , down from 10.4% . We continued Glimepiride  and switched INvokana  to Jardiance    Did not qualify for patient assistance for Jardiance  2024 Started Ozempic  04/2023, was approved for patient assistance 05/2023  We discontinued glimepiride  with an A1c of 6.6% February, 2025  SUBJECTIVE:   During the last visit (02/01/2024): A1c 6.6 %     Today (08/01/24): Craig Baldwin is here for a follow up on diabetes management. He  checks his blood sugars 0 times daily . 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  Patient has been noted with weight loss He is having issues with obtaining the Ozempic   No nausea or vomiting  Denies constipation or diarrhea    HOME DIABETES REGIMEN: Ozempic  1 mg weekly Jardiance  25 mg daily     Statin: yes ACE-I/ARB: yes Prior Diabetic Education: Yes, saw Leita 12/2021   METER DOWNLOAD SUMMARY: did not bring    DIABETIC COMPLICATIONS: Microvascular complications:  CKD,  neuropathy Denies: retinopathy Last eye exam: Completed 01/11/2024  Macrovascular complications:   Denies: CAD, PVD, CVA   PAST HISTORY: Past Medical History:  Past Medical History:  Diagnosis Date   Chronic kidney disease    Diabetes mellitus without complication (HCC)    Hyperlipidemia    Hypertension    Past Surgical History: No past surgical history on file.  Social History:  reports that he quit smoking about 27 years ago. His smoking use included cigarettes. He quit smokeless tobacco use about 6 years ago.  His smokeless tobacco use included chew. He reports that he does not currently use alcohol. He reports that he does not use drugs. Family History: No family history on file.   HOME MEDICATIONS: Allergies as of 08/01/2024       Reactions   Amoxicillin Itching        Medication List        Accurate as of August 01, 2024  9:43 AM. If you have any questions, ask your nurse or doctor.          apixaban  2.5 MG Tabs tablet Commonly known as: Eliquis  Take 1 tablet (2.5 mg total) by mouth 2 (two) times daily.   benazepril  10 MG tablet Commonly known as: LOTENSIN  Take 1 tablet by mouth once daily   benzonatate  100 MG capsule Commonly known as: TESSALON  Take 1 capsule (100 mg total) by mouth 3 (three) times daily as needed for cough.   cholecalciferol 25 MCG (1000 UNIT) tablet Commonly known as: VITAMIN D3  Take 1,000 Units by mouth daily.   empagliflozin  25 MG Tabs tablet Commonly known as: Jardiance  Take 1 tablet (25 mg total) by mouth daily before breakfast.   fluticasone  50 MCG/ACT nasal spray Commonly known as: FLONASE  Place 2 sprays into both nostrils daily.   glimepiride  1 MG tablet Commonly known as: AMARYL  Take 1 tablet (1 mg total) by mouth daily with breakfast.   levocetirizine 5 MG tablet Commonly known as: XYZAL  Take 1 tablet (5 mg total) by mouth every evening.   rosuvastatin  20 MG tablet Commonly known as: CRESTOR  Take 1 tablet by  mouth once daily   Semaglutide  (1 MG/DOSE) 4 MG/3ML Sopn Inject 1 mg as directed once a week.   Ozempic  (0.25 or 0.5 MG/DOSE) 2 MG/3ML Sopn Generic drug: Semaglutide (0.25 or 0.5MG /DOS) Inject 0.5 mg into the skin once a week.   Semaglutide (0.25 or 0.5MG /DOS) 2 MG/3ML Sopn Inject 0.5 mg into the skin once a week.   tamsulosin  0.4 MG Caps capsule Commonly known as: FLOMAX  TAKE 1 CAPSULE BY MOUTH ONCE DAILY AFTER SUPPER   triamcinolone ointment 0.1 % Commonly known as: KENALOG Apply topically 2 (two) times daily as needed.         ALLERGIES: Allergies  Allergen Reactions   Amoxicillin Itching     REVIEW OF SYSTEMS: A comprehensive ROS was conducted with the patient and is negative except as per HPI    OBJECTIVE:   VITAL SIGNS: BP 120/80 (BP Location: Left Arm, Patient Position: Sitting, Cuff Size: Normal)   Pulse 66   Ht 5' 11 (1.803 m)   Wt 219 lb (99.3 kg)   SpO2 97%   BMI 30.54 kg/m     Filed Weights   08/01/24 0936  Weight: 219 lb (99.3 kg)     PHYSICAL EXAM:  General: Pt appears well and is in NAD  Chest: Clear with good BS bilat   Heart: RRR  Extremities:  Lower extremities - No pretibial edema  Neuro: MS is good with appropriate affect, pt is alert and Ox3    DM foot exam: 02/01/2024  The skin of the feet is intact without sores or ulcerations. The pedal pulses are 1+ on right and 1+ on left. The sensation is decreased to a screening 5.07, 10 gram monofilament bilaterally at the toes    DATA REVIEWED:  Lab Results  Component Value Date   HGBA1C 6.2 (A) 02/01/2024   HGBA1C 6.6 (A) 07/29/2023   HGBA1C 8.9 (A) 05/14/2023    Latest Reference Range & Units 04/11/24 12:06  Sodium 135 - 145 mEq/L 138  Potassium 3.5 - 5.1 mEq/L 5.0  Chloride 96 - 112 mEq/L 105  CO2 19 - 32 mEq/L 25  Glucose 70 - 99 mg/dL 876 (H)  BUN 6 - 23 mg/dL 31 (H)  Creatinine 9.59 - 1.50 mg/dL 7.58 (H)  Calcium  8.4 - 10.5 mg/dL 9.4  Alkaline Phosphatase 39 - 117  U/L 55  Albumin 3.5 - 5.2 g/dL 4.6  AST 0 - 37 U/L 12  ALT 0 - 53 U/L 13  Total Protein 6.0 - 8.3 g/dL 7.0  Total Bilirubin 0.2 - 1.2 mg/dL 0.6  GFR >39.99 mL/min 26.13 (L)  Total CHOL/HDL Ratio  3  Cholesterol 0 - 200 mg/dL 86  HDL Cholesterol >60.99 mg/dL 72.19 (L)  LDL (calc) 0 - 99 mg/dL 39  MICROALB/CREAT RATIO 0.0 - 30.0 mg/g 8.8  NonHDL  58.45  Triglycerides 0.0 - 149.0 mg/dL 04.9  VLDL 0.0 - 59.9 mg/dL 19.0  Latest Reference Range & Units 04/11/24 12:06  Creatinine,U mg/dL 13.4  Microalb, Ur 0.0 - 1.9 mg/dL 0.8  MICROALB/CREAT RATIO 0.0 - 30.0 mg/g 8.8     In office BG 156 Mg/DL  ASSESSMENT / PLAN / RECOMMENDATIONS:   1) Type 2 Diabetes Mellitus, optimally controlled, With CKD IV and neuropathic  complications - Most recent A1c of 7.0%. Goal A1c < 7.0 %.    -A1c has increased from 6.6% to 7.0% this is partly due to the fact that he has not been using Ozempic  1 mg due to patient assistance form issues, but rather has been continuing to use the Ozempic  0.5 mg until his application has been renewed, the patient will start using Ozempic  1 mg as he has a shipment -We had discontinued glimepiride  to prevent hypoglycemia, no intolerance - No changes  MEDICATIONS: Take Ozempic  1 mg weekly Continue Jardiance  25 mg, 1 tablet  daily    EDUCATION / INSTRUCTIONS: BG monitoring instructions: Patient does't trust the validity of glucose meter  Call [facility] if: BG persistently < 70 I reviewed the Rule of 15 for the treatment of hypoglycemia in detail with the patient. Literature supplied.   2) Diabetic complications:  Eye: Does not have known diabetic retinopathy.  Neuro/ Feet: Does  have known diabetic peripheral neuropathy. Renal: Patient does  have known baseline CKD. Follows with Dr. Adegotoye with [facility]   3) Dyslipidemia :  - Per cardiology    F/U in 6 months    Signed electronically by: Stefano Redgie Butts, MD  [facility]  Endocrinology  [facility] [address] [place], [place] [address] Phone: [phone] FAX: [phone]   CC: Dorina Dallas RIGGERS [address] [place] [address] Phone: [phone]  Fax: [phone]    Return to Endocrinology clinic as below: No future appointments.

## 2024-08-01 NOTE — Patient Instructions (Signed)
 Continue  Ozempic  1 mg weekly  Continue  Jardiance  25 mg daily     HOW TO TREAT LOW BLOOD SUGARS (Blood sugar LESS THAN 70 MG/DL) Please follow the RULE OF 15 for the treatment of hypoglycemia treatment (when your (blood sugars are less than 70 mg/dL)   STEP 1: Take 15 grams of carbohydrates when your blood sugar is low, which includes:  3-4 GLUCOSE TABS  OR 3-4 OZ OF JUICE OR REGULAR SODA OR ONE TUBE OF GLUCOSE GEL    STEP 2: RECHECK blood sugar in 15 MINUTES STEP 3: If your blood sugar is still low at the 15 minute recheck --> then, go back to STEP 1 and treat AGAIN with another 15 grams of carbohydrates.

## 2024-08-17 ENCOUNTER — Other Ambulatory Visit: Payer: Self-pay | Admitting: Medical

## 2024-10-03 ENCOUNTER — Other Ambulatory Visit: Payer: Self-pay | Admitting: Cardiology

## 2024-10-04 ENCOUNTER — Other Ambulatory Visit: Payer: Self-pay

## 2024-10-04 DIAGNOSIS — I82431 Acute embolism and thrombosis of right popliteal vein: Secondary | ICD-10-CM

## 2024-10-04 MED ORDER — APIXABAN 2.5 MG PO TABS: 2.5000 mg | ORAL_TABLET | Freq: Two times a day (BID) | ORAL | 0 refills | Status: DC

## 2024-10-04 NOTE — Telephone Encounter (Signed)
 Refill request from [facility] requesting a refill on Eliquis  2.5 mg BID. ERX sent.

## 2024-10-10 ENCOUNTER — Ambulatory Visit: Payer: Self-pay | Admitting: Medical

## 2024-10-10 ENCOUNTER — Inpatient Hospital Stay: Attending: Hematology & Oncology

## 2024-10-10 ENCOUNTER — Inpatient Hospital Stay (HOSPITAL_BASED_OUTPATIENT_CLINIC_OR_DEPARTMENT_OTHER): Admitting: Medical Oncology

## 2024-10-10 ENCOUNTER — Encounter: Payer: Self-pay | Admitting: Medical

## 2024-10-10 ENCOUNTER — Ambulatory Visit: Admitting: Medical

## 2024-10-10 ENCOUNTER — Other Ambulatory Visit: Payer: Self-pay

## 2024-10-10 ENCOUNTER — Ambulatory Visit (INDEPENDENT_AMBULATORY_CARE_PROVIDER_SITE_OTHER): Admitting: *Deleted

## 2024-10-10 VITALS — BP 100/61 | HR 53 | Resp 19 | Ht 71.0 in | Wt 214.0 lb

## 2024-10-10 VITALS — BP 114/55 | HR 58 | Temp 98.6°F | Resp 16 | Ht 70.5 in | Wt 214.4 lb

## 2024-10-10 VITALS — BP 110/60 | HR 58 | Temp 98.6°F | Resp 14 | Ht 70.5 in | Wt 214.0 lb

## 2024-10-10 DIAGNOSIS — E1169 Type 2 diabetes mellitus with other specified complication: Secondary | ICD-10-CM | POA: Diagnosis not present

## 2024-10-10 DIAGNOSIS — K623 Rectal prolapse: Secondary | ICD-10-CM | POA: Diagnosis not present

## 2024-10-10 DIAGNOSIS — E1165 Type 2 diabetes mellitus with hyperglycemia: Secondary | ICD-10-CM | POA: Diagnosis not present

## 2024-10-10 DIAGNOSIS — I82431 Acute embolism and thrombosis of right popliteal vein: Secondary | ICD-10-CM | POA: Diagnosis not present

## 2024-10-10 DIAGNOSIS — E785 Hyperlipidemia, unspecified: Secondary | ICD-10-CM | POA: Diagnosis not present

## 2024-10-10 DIAGNOSIS — G629 Polyneuropathy, unspecified: Secondary | ICD-10-CM | POA: Diagnosis not present

## 2024-10-10 DIAGNOSIS — D6851 Activated protein C resistance: Secondary | ICD-10-CM | POA: Diagnosis not present

## 2024-10-10 DIAGNOSIS — Z87891 Personal history of nicotine dependence: Secondary | ICD-10-CM | POA: Diagnosis not present

## 2024-10-10 DIAGNOSIS — D6859 Other primary thrombophilia: Secondary | ICD-10-CM | POA: Diagnosis not present

## 2024-10-10 DIAGNOSIS — Z7985 Long-term (current) use of injectable non-insulin antidiabetic drugs: Secondary | ICD-10-CM

## 2024-10-10 DIAGNOSIS — Z7901 Long term (current) use of anticoagulants: Secondary | ICD-10-CM | POA: Diagnosis not present

## 2024-10-10 DIAGNOSIS — Z7984 Long term (current) use of oral hypoglycemic drugs: Secondary | ICD-10-CM

## 2024-10-10 DIAGNOSIS — I82531 Chronic embolism and thrombosis of right popliteal vein: Secondary | ICD-10-CM | POA: Insufficient documentation

## 2024-10-10 DIAGNOSIS — I10 Essential (primary) hypertension: Secondary | ICD-10-CM

## 2024-10-10 DIAGNOSIS — Z0000 Encounter for general adult medical examination without abnormal findings: Secondary | ICD-10-CM

## 2024-10-10 LAB — COMPREHENSIVE METABOLIC PANEL WITH GFR
ALT: 16 U/L (ref 0–53)
AST: 15 U/L (ref 0–37)
Albumin: 4.6 g/dL (ref 3.5–5.2)
Alkaline Phosphatase: 52 U/L (ref 39–117)
BUN: 32 mg/dL — ABNORMAL HIGH (ref 6–23)
CO2: 26 meq/L (ref 19–32)
Calcium: 9.6 mg/dL (ref 8.4–10.5)
Chloride: 106 meq/L (ref 96–112)
Creatinine, Ser: 2.34 mg/dL — ABNORMAL HIGH (ref 0.40–1.50)
GFR: 26.97 mL/min — ABNORMAL LOW (ref >60.00)
Glucose, Bld: 106 mg/dL — ABNORMAL HIGH (ref 70–99)
Potassium: 4.9 meq/L (ref 3.5–5.1)
Sodium: 140 meq/L (ref 135–145)
Total Bilirubin: 0.7 mg/dL (ref 0.2–1.2)
Total Protein: 6.8 g/dL (ref 6.0–8.3)

## 2024-10-10 LAB — CBC
HCT: 40.5 % (ref 39.0–52.0)
Hemoglobin: 13.6 g/dL (ref 13.0–17.0)
MCH: 30.4 pg (ref 26.0–34.0)
MCHC: 33.6 g/dL (ref 30.0–36.0)
MCV: 90.4 fL (ref 80.0–100.0)
Platelets: 194 K/uL (ref 150–400)
RBC: 4.48 MIL/uL (ref 4.22–5.81)
RDW: 13.1 % (ref 11.5–15.5)
WBC: 8.6 K/uL (ref 4.0–10.5)
nRBC: 0 % (ref 0.0–0.2)

## 2024-10-10 LAB — LIPID PANEL
Cholesterol: 82 mg/dL (ref 0–200)
HDL: 29.2 mg/dL — ABNORMAL LOW (ref >39.00)
LDL Cholesterol: 35 mg/dL (ref 0–99)
NonHDL: 52.51
Total CHOL/HDL Ratio: 3
Triglycerides: 87 mg/dL (ref 0.0–149.0)
VLDL: 17.4 mg/dL (ref 0.0–40.0)

## 2024-10-10 LAB — CMP (CANCER CENTER ONLY)
ALT: 18 U/L (ref 0–44)
AST: 19 U/L (ref 15–41)
Albumin: 4.5 g/dL (ref 3.5–5.0)
Alkaline Phosphatase: 55 U/L (ref 38–126)
Anion gap: 12 (ref 5–15)
BUN: 31 mg/dL — ABNORMAL HIGH (ref 8–23)
CO2: 23 mmol/L (ref 22–32)
Calcium: 9.7 mg/dL (ref 8.9–10.3)
Chloride: 106 mmol/L (ref 98–111)
Creatinine: 2.27 mg/dL — ABNORMAL HIGH (ref 0.61–1.24)
GFR, Estimated: 30 mL/min — ABNORMAL LOW (ref >60)
Glucose, Bld: 119 mg/dL — ABNORMAL HIGH (ref 70–99)
Potassium: 5.1 mmol/L (ref 3.5–5.1)
Sodium: 140 mmol/L (ref 135–145)
Total Bilirubin: 0.6 mg/dL (ref 0.0–1.2)
Total Protein: 7.1 g/dL (ref 6.5–8.1)

## 2024-10-10 MED ORDER — APIXABAN 2.5 MG PO TABS: 2.5000 mg | ORAL_TABLET | Freq: Two times a day (BID) | ORAL | 3 refills | Status: AC

## 2024-10-10 NOTE — Patient Instructions (Signed)
 Rectal bulge, possible rectal prolapse or hernia Rectal bulge during bowel movements for the past year. Differential includes rectal prolapse or hernia(or other dx?). He declined pursue aggressive evaluation or treatment. Even denies recommendation to evaluate today. - Monitor for changes in symptoms or worsening of the bulge. - Consider referral to GI for at least anoscopy if symptoms worsen or he desires further evaluation. Please notify me if willing to do work up.  Chronic kidney disease, stage 4 Chronic kidney disease stage 4 with a GFR of 26 and creatinine of 2.4 as of April. - Continue follow-up with nephrologist every six months, next appointment likely in April.  Type 2 diabetes mellitus Type 2 diabetes mellitus managed by endocrinologist. Current medications include Jardiance  and Ozempic .  Essential hypertension Blood pressure well-controlled at 110/60. Occasional dizziness upon standing, likely due to transient blood pressure changes. - Monitor blood pressure and symptoms of dizziness. - Advise caution when changing positions to prevent dizziness. -stay hydrated.  Deep vein thrombosis (DVT) Follow-up with Doctor Inover for DVT. Eliquis  dosage was reduced previously, and he hopes to discontinue it. - Inform Doctor Inover of recent metabolic and lipid panel results.  General Health Maintenance Discussion of previous incomplete colonoscopy in 2011. No recent colonoscopy completed. He is unsure of last complete colonoscopy date. Pt declines recommendation to do.  - Consider obtaining previous colonoscopy records from Dr. Theressa office.   Follow up in 6 months or sooner if needed

## 2024-10-10 NOTE — Progress Notes (Signed)
 Subjective:    Patient ID: Craig Baldwin, male    DOB: 1951/07/12, 73 y.o.   MRN: [id number]  HPI  Last visit hpi with me ws in April  Type 2 Diabetes Mellitus Improved glycemic control with A1c at 6.2. Weight loss of 9 pounds noted with ozempic . Endocrinologist plans to increase Ozempic  to 1 mg weekly. - Continue Jardiance  25 mg daily. - Continue Ozempic  0.5 mg weekly until new dose available. - Monitor blood glucose levels regularly. - Order metabolic panel.   Chronic Kidney Disease GFR remains in the 20s. Under nephrology care. Advised to avoid NSAIDs due to nephrotoxicity and Eliquis  interaction. - Avoid NSAIDs. - Use Tylenol for pain management. - Continue follow-up with nephrologist. - Order urine microalbumin test.   Hypertension Well-controlled with Lotensin  10 mg daily. Current BP 112/62 mmHg. - Continue Lotensin  10 mg daily.   Hyperlipidemia Well-controlled with Crestor  20 mg daily. Lipid panel within normal limits. - Continue Crestor  20 mg daily. - Order lipid panel.       Craig Baldwin is a 73 year old male who presents with a rectal bulge.  He has experienced a sensation of a bulge in the rectal area during bowel movements over the past year. The bulge is non-painful, not increasing in size, and only noticeable during bathroom use. No associated bleeding or itching is present. Describes that resolves after bm.  Approximately 14 years ago, he underwent an incomplete colonoscopy and was referred to [facility] for further evaluation, but the endoscopy was not completed. He does not recall the specific reason for the procedure or any complications. He has not had a follow-up colonoscopy since then. Records are limited.  He is currently on Jardiance  and Ozempic  for diabetes management and is under the care of an endocrinologist. He also has chronic kidney disease and is followed by a nephrologist, with his last appointment in August. His GFR was noted  to be 26 with a creatinine level of 2.4 in April.  He experiences occasional/rare dizziness when standing up, particularly after bending down, but it resolves quickly. Usually only when bent over fully and standing up quickly.  He is also on Eliquis  for a history of deep vein thrombosis (DVT) and is seeing a specialist for follow-up. His Eliquis  dosage was recently reduced.   Review of Systems  Constitutional:  Negative for chills, fatigue and fever.  HENT:  Negative for congestion, ear discharge and ear pain.   Respiratory:  Negative for cough, chest tightness, shortness of breath and wheezing.   Cardiovascular:  Negative for chest pain and palpitations.  Gastrointestinal:  Negative for abdominal pain, constipation, nausea and vomiting.       See hpi  Genitourinary:  Negative for dysuria, frequency and urgency.  Musculoskeletal:  Negative for back pain, joint swelling and neck stiffness.  Skin:  Negative for rash.  Neurological:  Positive for dizziness. Negative for facial asymmetry and light-headedness.  Hematological:  Negative for adenopathy.  Psychiatric/Behavioral:  Negative for behavioral problems and dysphoric mood. The patient is not nervous/anxious.     Past Medical History:  Diagnosis Date   Chronic kidney disease    Diabetes mellitus without complication (HCC)    Hyperlipidemia    Hypertension      Social History   Socioeconomic History   Marital status: Married    Spouse name: Not on file   Number of children: Not on file   Years of education: Not on file   Highest education level: 9th grade  Occupational  History   Not on file  Tobacco Use   Smoking status: Former    Current packs/day: 0.00    Types: Cigarettes    Quit date: 12/26/1996    Years since quitting: 27.8   Smokeless tobacco: Former    Types: Chew    Quit date: 12/28/2017  Vaping Use   Vaping status: Never Used  Substance and Sexual Activity   Alcohol use: Not Currently    Comment: Occasional    Drug use: Never   Sexual activity: Not on file  Other Topics Concern   Not on file  Social History Narrative   Not on file   Social Drivers of Health   Financial Resource Strain: Low Risk  (10/10/2024)   Overall Financial Resource Strain (CARDIA)    Difficulty of Paying Living Expenses: Not very hard  Food Insecurity: No Food Insecurity (10/04/2024)   Hunger Vital Sign    Worried About Running Out of Food in the Last Year: Never true    Ran Out of Food in the Last Year: Never true  Transportation Needs: No Transportation Needs (10/04/2024)   PRAPARE - Administrator, Civil Service (Medical): No    Lack of Transportation (Non-Medical): No  Physical Activity: Insufficiently Active (10/10/2024)   Exercise Vital Sign    Days of Exercise per Week: 3 days    Minutes of Exercise per Session: 10 min  Stress: No Stress Concern Present (10/04/2024)   [employer] of Occupational Health - Occupational Stress Questionnaire    Feeling of Stress: Not at all  Social Connections: Socially Isolated (10/10/2024)   Social Connection and Isolation Panel    Frequency of Communication with Friends and Family: Once a week    Frequency of Social Gatherings with Friends and Family: Once a week    Attends Religious Services: Never    Database administrator or Organizations: No    Attends Banker Meetings: Never    Marital Status: Married  Catering manager Violence: Not At Risk (10/10/2024)   Humiliation, Afraid, Rape, and Kick questionnaire    Fear of Current or Ex-Partner: No    Emotionally Abused: No    Physically Abused: No    Sexually Abused: No    No past surgical history on file.  No family history on file.  Allergies  Allergen Reactions   Amoxicillin Itching    Current Outpatient Medications on File Prior to Visit  Medication Sig Dispense Refill   apixaban  (ELIQUIS ) 2.5 MG TABS tablet Take 1 tablet (2.5 mg total) by mouth 2 (two) times daily. 60 tablet  0   benazepril  (LOTENSIN ) 10 MG tablet Take 1 tablet by mouth once daily 90 tablet 0   cholecalciferol (VITAMIN D3) 25 MCG (1000 UNIT) tablet Take 1,000 Units by mouth daily.     empagliflozin  (JARDIANCE ) 25 MG TABS tablet Take 1 tablet (25 mg total) by mouth daily before breakfast. 30 tablet 11   rosuvastatin  (CRESTOR ) 20 MG tablet Take 1 tablet by mouth once daily 90 tablet 3   Semaglutide , 1 MG/DOSE, 4 MG/3ML SOPN Inject 1 mg as directed once a week.     tamsulosin  (FLOMAX ) 0.4 MG CAPS capsule TAKE 1 CAPSULE BY MOUTH ONCE DAILY AFTER SUPPER 90 capsule 0   triamcinolone ointment (KENALOG) 0.1 % Apply topically 2 (two) times daily as needed.     No current facility-administered medications on file prior to visit.    BP 110/60   Pulse (!) 58  Temp 98.6 F (37 C) (Oral)   Resp 14   Ht 5' 10.5 (1.791 m)   Wt 214 lb (97.1 kg)   SpO2 97%   BMI 30.27 kg/m        Objective:   Physical Exam  General Mental Status- Alert. General Appearance- Not in acute distress.   Skin General: Color- Normal Color. Moisture- Normal Moisture.  Neck No JVD.  Chest and Lung Exam Auscultation: Breath Sounds:-Normal.  Cardiovascular Auscultation:Rythm- Regular. Murmurs & Other Heart Sounds:Auscultation of the heart reveals- No Murmurs.  Abdomen Inspection:-Inspeection Normal. Palpation/Percussion:Note:No mass. Palpation and Percussion of the abdomen reveal- Non Tender, Non Distended + BS, no rebound or guarding.    Neurologic Cranial Nerve exam:- CN III-XII intact(No nystagmus), symmetric smile. Strength:- 5/5 equal and symmetric strength both upper and lower extremities.   Rectal- pt deferred labs.    Assessment & Plan:   Patient Instructions  Rectal bulge, possible rectal prolapse or hernia Rectal bulge during bowel movements for the past year. Differential includes rectal prolapse or hernia(or other dx?). He declined pursue aggressive evaluation or treatment. Even denies  recommendation to evaluate today. - Monitor for changes in symptoms or worsening of the bulge. - Consider referral to GI for at least anoscopy if symptoms worsen or he desires further evaluation. Please notify me if willing to do work up.  Chronic kidney disease, stage 4 Chronic kidney disease stage 4 with a GFR of 26 and creatinine of 2.4 as of April. - Continue follow-up with nephrologist every six months, next appointment likely in April.  Type 2 diabetes mellitus Type 2 diabetes mellitus managed by endocrinologist. Current medications include Jardiance  and Ozempic .  Essential hypertension Blood pressure well-controlled at 110/60. Occasional dizziness upon standing, likely due to transient blood pressure changes. - Monitor blood pressure and symptoms of dizziness. - Advise caution when changing positions to prevent dizziness. -stay hydrated.  Deep vein thrombosis (DVT) Follow-up with Doctor Inover for DVT. Eliquis  dosage was reduced previously, and he hopes to discontinue it. - Inform Doctor Inover of recent metabolic and lipid panel results.  General Health Maintenance Discussion of previous incomplete colonoscopy in 2011. No recent colonoscopy completed. He is unsure of last complete colonoscopy date. Pt declines recommendation to do.  - Consider obtaining previous colonoscopy records from Dr. Theressa office.   Follow up in 6 months or sooner if needed   [employer], PA-C

## 2024-10-10 NOTE — Progress Notes (Signed)
 Hematology and Oncology Follow Up Visit  Craig Baldwin [id number] 10/07/1951 73 y.o. 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   He is doing well other than concern for his wife who is in the hospital with pneumonia.   He continues to take his Eliquis  as directed. No missed doses. No financial concerns regarding this prescription mentioned.   There has been no bleeding to his knowledge: denies epistaxis, gingivitis, hemoptysis, hematemesis, hematuria, melena, excessive bruising, blood donation.    No issues with infection. No fever, chills, n/v, cough, rash, SOB, chest pain, palpitations, abdominal pain or changes in bowel or bladder habits.  No falls or syncope.  No swelling in his extremities.  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  Appetite and hydration are good.  Wt Readings from Last 3 Encounters:  10/10/24 214 lb (97.1 kg)  10/10/24 214 lb (97.1 kg)  10/10/24 214 lb 6.4 oz (97.3 kg)   ECOG Performance Status: 0 - Asymptomatic  Medications:  Allergies as of 10/10/2024       Reactions   Amoxicillin Itching        Medication List        Accurate as of October 10, 2024  1:37 PM. If you have any questions, ask your nurse or doctor.          apixaban  2.5 MG Tabs tablet Commonly known as: Eliquis  Take 1 tablet (2.5 mg total) by mouth 2 (two) times daily.   benazepril  10 MG tablet Commonly known as: LOTENSIN  Take 1 tablet by mouth once daily   cholecalciferol 25 MCG (1000 UNIT) tablet Commonly known as: VITAMIN D3 Take 1,000 Units by mouth daily.   empagliflozin  25 MG Tabs tablet Commonly known as: Jardiance  Take 1 tablet (25 mg total) by mouth daily before breakfast.   rosuvastatin  20 MG tablet Commonly known as: CRESTOR  Take 1 tablet  by mouth once daily   Semaglutide  (1 MG/DOSE) 4 MG/3ML Sopn Inject 1 mg as directed once a week. What changed: Another medication with the same name was removed. Continue taking this medication, and follow the directions you see here. Changed by: CMA Lolita FALCON   tamsulosin  0.4 MG Caps capsule Commonly known as: FLOMAX  TAKE 1 CAPSULE BY MOUTH ONCE DAILY AFTER SUPPER   triamcinolone ointment 0.1 % Commonly known as: KENALOG Apply topically 2 (two) times daily as needed.        Allergies:  Allergies  Allergen Reactions   Amoxicillin Itching    Past Medical History, Surgical history, Social history, and Family History were reviewed and updated.  Review of Systems: All other 10 point review of systems is negative.   Physical Exam:  height is 5' 11 (1.803 m) and weight is 214 lb (97.1 kg). His blood pressure is 100/61 and his pulse is 53 (abnormal). His respiration is 19 and oxygen saturation is 100%.   Wt Readings from Last 3 Encounters:  10/10/24 214 lb (97.1 kg)  10/10/24 214 lb (97.1 kg)  10/10/24 214 lb 6.4 oz (97.3 kg)    Ocular: Sclerae unicteric, pupils equal, round and reactive to light Ear-nose-throat: Oropharynx clear, dentition fair Lymphatic: No cervical or supraclavicular adenopathy Lungs no rales or rhonchi, good excursion bilaterally Heart regular rate and rhythm, no  murmur appreciated Abd soft, nontender, positive bowel sounds MSK no focal spinal tenderness, no joint edema Neuro: non-focal, well-oriented, appropriate affect  Lab Results  Component Value Date   WBC 8.6 10/10/2024   HGB 13.6 10/10/2024   HCT 40.5 10/10/2024   MCV 90.4 10/10/2024   PLT 194 10/10/2024   No results found for: FERRITIN, IRON, TIBC, UIBC, IRONPCTSAT Lab Results  Component Value Date   RBC 4.48 10/10/2024   No results found for: KPAFRELGTCHN, LAMBDASER, KAPLAMBRATIO No results found for: IGGSERUM, IGA, IGMSERUM No results found for:  STEPHANY CARLOTA BENSON MARKEL EARLA JOANNIE DOC VICK, SPEI   Chemistry      Component Value Date/Time   NA 140 10/10/2024 1242   NA 135 10/08/2021 0852   K 5.1 10/10/2024 1242   CL 106 10/10/2024 1242   CO2 23 10/10/2024 1242   BUN 31 (H) 10/10/2024 1242   BUN 23 10/08/2021 0852   CREATININE 2.27 (H) 10/10/2024 1242   CREATININE 2.39 (H) 06/01/2020 1544      Component Value Date/Time   CALCIUM  9.7 10/10/2024 1242   ALKPHOS 55 10/10/2024 1242   AST 19 10/10/2024 1242   ALT 18 10/10/2024 1242   BILITOT 0.6 10/10/2024 1242      Encounter Diagnoses  Name Primary?   Factor V Leiden Yes   Primary hypercoagulable state    Impression and Plan: Craig Baldwin is a very pleasant 73 yo caucasian gentleman with chronic DVT in the right popliteal vein. 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  CBC and CMP stable Creatinine clearance of 40- if this drops below 25 we will need to discuss risk/benefit of continuing his medication.   He will continue his same regimen with Eliquis  2.5 mg PO BID.    RTC 12 months APP, labs (CBC, CMP) per pt request   Lauraine CHRISTELLA Dais, PA-C 10/20/20251:37 PM

## 2024-10-10 NOTE — Patient Instructions (Addendum)
 [name] , Thank you for taking time out of your busy schedule to complete your Annual Wellness Visit with me. I enjoyed our conversation and look forward to speaking with you again next year. I, as well as your care team,  appreciate your ongoing commitment to your health goals. Please review the following plan we discussed and let me know if I can assist you in the future. Your Game plan/ To Do List    Referrals: If you haven't heard from the office you've been referred to, please reach out to them at the phone provided.   Please call [facility] for your annual diabetic eye: [phone]  Call Dr Claudene, GI for your rectal concerns.  Follow up Visits: Next Medicare AWV with our clinical staff: 10/16/25 9am, telephone.  Next Office Visit with your provider: 10/10/24 11am, Dallas Maxwell, PA-C  Clinician Recommendations:  Aim for 30 minutes of exercise or brisk walking, 6-8 glasses of water, and 5 servings of fruits and vegetables each day.   You will need to get the following vaccines at your local pharmacy: Flu (if you do not get today).      This is a list of the screening recommended for you and due dates:  Health Maintenance  Topic Date Due   Hepatitis C Screening  Never done   DTaP/Tdap/Td vaccine (1 - Tdap) Never done   Pneumococcal Vaccine for age over 60 (1 of 2 - PCV) Never done   Zoster (Shingles) Vaccine (1 of 2) Never done   Colon Cancer Screening  Never done   Medicare Annual Wellness Visit  11/17/2020   Flu Shot  07/22/2024   Eye exam for diabetics  01/10/2025   Complete foot exam   01/31/2025   Hemoglobin A1C  02/01/2025   Yearly kidney function blood test for diabetes  04/11/2025   Yearly kidney health urinalysis for diabetes  04/11/2025   Meningitis B Vaccine  Aged Out   COVID-19 Vaccine  Discontinued    Advanced directives: (Copy Requested) Please bring a copy of your health care power of attorney and living will to the office to be added to your chart  at your convenience. You can mail to [facility] [address] [place], [place] [address] or email to [email or web address] Advance Care Planning is important because it:  [x]  Makes sure you receive the medical care that is consistent with your values, goals, and preferences  [x]  It provides guidance to your family and loved ones and reduces their decisional burden about whether or not they are making the right decisions based on your wishes.  Follow the link provided in your after visit summary or read over the paperwork we have mailed to you to help you started getting your Advance Directives in place. If you need assistance in completing these, please reach out to us  so that we can help you!  See attachments for Preventive Care and Fall Prevention Tips.

## 2024-10-10 NOTE — Progress Notes (Signed)
 Subjective:   Craig Baldwin is a 73 y.o. who presents for a Medicare Wellness preventive visit.  As a reminder, Annual Wellness Visits don't include a physical exam, and some assessments may be limited, especially if this visit is performed virtually. We may recommend an in-person follow-up visit with your provider if needed.  Visit Complete: In person  Persons Participating in Visit: Patient.  AWV Questionnaire: Yes: Patient Medicare AWV questionnaire was completed by the patient on 10/04/24; I have confirmed that all information answered by patient is correct and no changes since this date.  Cardiac Risk Factors include: advanced age (>14men, >47 women);male gender;diabetes mellitus;dyslipidemia;hypertension;Other (see comment), Risk factor comments: CKD, aortic atherosclerosis     Objective:    Today's Vitals   10/10/24 1020  BP: (!) 114/55  Pulse: (!) 58  Resp: 16  Temp: 98.6 F (37 C)  TempSrc: Oral  SpO2: 97%  Weight: 214 lb 6.4 oz (97.3 kg)  Height: 5' 10.5 (1.791 m)   Body mass index is 30.33 kg/m.     10/10/2024   10:31 AM 10/12/2023    9:55 AM 04/15/2023    9:20 AM 10/17/2022   10:33 AM 04/18/2022    8:51 AM 01/21/2022    9:58 AM 12/24/2021    1:20 PM  Advanced Directives  Does Patient Have a Medical Advance Directive? No No No No No No No  Would patient like information on creating a medical advance directive? No - Patient declined No - Guardian declined No - Guardian declined  No - Patient declined No - Patient declined No - Patient declined    Current Medications (verified) Outpatient Encounter Medications as of 10/10/2024  Medication Sig   apixaban  (ELIQUIS ) 2.5 MG TABS tablet Take 1 tablet (2.5 mg total) by mouth 2 (two) times daily.   benazepril  (LOTENSIN ) 10 MG tablet Take 1 tablet by mouth once daily   cholecalciferol (VITAMIN D3) 25 MCG (1000 UNIT) tablet Take 1,000 Units by mouth daily.   empagliflozin  (JARDIANCE ) 25 MG TABS tablet Take 1  tablet (25 mg total) by mouth daily before breakfast.   rosuvastatin  (CRESTOR ) 20 MG tablet Take 1 tablet by mouth once daily   Semaglutide , 1 MG/DOSE, 4 MG/3ML SOPN Inject 1 mg as directed once a week.   tamsulosin  (FLOMAX ) 0.4 MG CAPS capsule TAKE 1 CAPSULE BY MOUTH ONCE DAILY AFTER SUPPER   triamcinolone ointment (KENALOG) 0.1 % Apply topically 2 (two) times daily as needed.   [DISCONTINUED] Semaglutide ,0.25 or 0.5MG /DOS, (OZEMPIC , 0.25 OR 0.5 MG/DOSE,) 2 MG/3ML SOPN Inject 0.5 mg into the skin once a week.   [DISCONTINUED] Semaglutide ,0.25 or 0.5MG /DOS, 2 MG/3ML SOPN Inject 0.5 mg into the skin once a week.   No facility-administered encounter medications on file as of 10/10/2024.    Allergies (verified) Amoxicillin   History: Past Medical History:  Diagnosis Date   Chronic kidney disease    Diabetes mellitus without complication (HCC)    Hyperlipidemia    Hypertension    History reviewed. No pertinent surgical history. History reviewed. No pertinent family history. Social History   Socioeconomic History   Marital status: Married    Spouse name: Not on file   Number of children: Not on file   Years of education: Not on file   Highest education level: 9th grade  Occupational History   Not on file  Tobacco Use   Smoking status: Former    Current packs/day: 0.00    Types: Cigarettes    Quit date: 12/26/1996  Years since quitting: 27.8   Smokeless tobacco: Former    Types: Chew    Quit date: 12/28/2017  Vaping Use   Vaping status: Never Used  Substance and Sexual Activity   Alcohol use: Not Currently    Comment: Occasional   Drug use: Never   Sexual activity: Not on file  Other Topics Concern   Not on file  Social History Narrative   Not on file   Social Drivers of Health   Financial Resource Strain: Low Risk  (10/10/2024)   Overall Financial Resource Strain (CARDIA)    Difficulty of Paying Living Expenses: Not very hard  Food Insecurity: No Food Insecurity  (10/04/2024)   Hunger Vital Sign    Worried About Running Out of Food in the Last Year: Never true    Ran Out of Food in the Last Year: Never true  Transportation Needs: No Transportation Needs (10/04/2024)   PRAPARE - Administrator, Civil Service (Medical): No    Lack of Transportation (Non-Medical): No  Physical Activity: Insufficiently Active (10/10/2024)   Exercise Vital Sign    Days of Exercise per Week: 3 days    Minutes of Exercise per Session: 10 min  Stress: No Stress Concern Present (10/04/2024)   [employer] of Occupational Health - Occupational Stress Questionnaire    Feeling of Stress: Not at all  Social Connections: Socially Isolated (10/10/2024)   Social Connection and Isolation Panel    Frequency of Communication with Friends and Family: Once a week    Frequency of Social Gatherings with Friends and Family: Once a week    Attends Religious Services: Never    Database administrator or Organizations: No    Attends Engineer, structural: Never    Marital Status: Married    Tobacco Counseling Counseling given: Not Answered    Clinical Intake:  Pre-visit preparation completed: Yes  Pain : No/denies pain     BMI - recorded: 30.33 Nutritional Status: BMI > 30  Obese Nutritional Risks: None Diabetes: Yes CBG done?: No Did pt. bring in CBG monitor from home?: No  Lab Results  Component Value Date   HGBA1C 7.0 (A) 08/01/2024   HGBA1C 6.2 (A) 02/01/2024   HGBA1C 6.6 (A) 07/29/2023     How often do you need to have someone help you when you read instructions, pamphlets, or other written materials from your doctor or pharmacy?: 1 - Never What is the last grade level you completed in school?: 9th  Interpreter Needed?: No  Information entered by :: Lolita Libra, CMA(AAMA)   Activities of Daily Living     10/04/2024    3:15 PM  In your present state of health, do you have any difficulty performing the following activities:   Hearing? 0  Vision? 0  Difficulty concentrating or making decisions? 0  Walking or climbing stairs? 0  Dressing or bathing? 0  Doing errands, shopping? 0  Preparing Food and eating ? N  Using the Toilet? N  In the past six months, have you accidently leaked urine? N  Do you have problems with loss of bowel control? N  Managing your Medications? N  Managing your Finances? N  Housekeeping or managing your Housekeeping? N    Patient Care Team: Saguier, Edward, PA-C as PCP - General (Internal Medicine) Jeffrie Oneil BROCKS, MD as PCP - Cardiology (Cardiology) [facility] (Optometry)  I have updated your Care Teams any recent Medical Services you may  have received from other providers in the past year.     Assessment:   This is a routine wellness examination for Craig Baldwin.  Hearing/Vision screen Hearing Screening - Comments:: Denies hearing difficulties. Vision Screening - Comments:: Up to date with routine eye exams with [facility], [facility]   Goals Addressed               This Visit's Progress     Patient Stated (pt-stated)        Maintain and active lifestyle       Depression Screen     10/10/2024   10:30 AM 04/15/2023   11:03 AM 01/21/2022    9:57 AM 10/31/2021    9:22 AM  PHQ 2/9 Scores  PHQ - 2 Score 0 0 0 0  PHQ- 9 Score 0       Fall Risk     10/04/2024    3:15 PM 04/15/2023   11:04 AM 01/21/2022    9:57 AM 10/31/2021    9:21 AM  Fall Risk   Falls in the past year? 0 0 0 0  Number falls in past yr: 0 0  0  Injury with Fall? 0 0  0  Risk for fall due to : No Fall Risks No Fall Risks    Follow up Education provided Falls evaluation completed;Education provided      MEDICARE RISK AT HOME:  Medicare Risk at Home Any stairs in or around the home?: (Patient-Rptd) Yes If so, are there any without handrails?: (Patient-Rptd) No Home free of loose throw rugs in walkways, pet beds, electrical cords, etc?: (Patient-Rptd)  Yes Adequate lighting in your home to reduce risk of falls?: (Patient-Rptd) Yes Life alert?: (Patient-Rptd) No Use of a cane, walker or w/c?: (Patient-Rptd) No Grab bars in the bathroom?: (Patient-Rptd) No Shower chair or bench in shower?: (Patient-Rptd) No Elevated toilet seat or a handicapped toilet?: (Patient-Rptd) No  TIMED UP AND GO:  Was the test performed?  Yes  Length of time to ambulate 10 feet: 8 sec Gait steady and fast without use of assistive device  Cognitive Function: 6CIT completed        10/10/2024   10:33 AM  6CIT Screen  What Year? 0 points  What month? 0 points  What time? 0 points  Count back from 20 0 points  Months in reverse 0 points  Repeat phrase 0 points  Total Score 0 points    Immunizations Immunization History  Administered Date(s) Administered   Fluad Quad(high Dose 65+) 10/31/2021   INFLUENZA, HIGH DOSE SEASONAL PF 11/18/2019   PNEUMOCOCCAL CONJUGATE-20 03/01/2023   Respiratory Syncytial Virus Vaccine,Recomb Aduvanted(Arexvy) 03/01/2023   Tdap 10/07/2019   Zoster Recombinant(Shingrix) 03/01/2023, 06/29/2023    Screening Tests Health Maintenance  Topic Date Due   Hepatitis C Screening  Never done   Colonoscopy  Never done   Medicare Annual Wellness (AWV)  11/17/2020   Influenza Vaccine  03/21/2025 (Originally 07/22/2024)   OPHTHALMOLOGY EXAM  01/10/2025   FOOT EXAM  01/31/2025   HEMOGLOBIN A1C  02/01/2025   Diabetic kidney evaluation - eGFR measurement  04/11/2025   Diabetic kidney evaluation - Urine ACR  04/11/2025   DTaP/Tdap/Td (2 - Td or Tdap) 10/06/2029   Pneumococcal Vaccine: 50+ Years  Completed   Zoster Vaccines- Shingrix  Completed   Meningococcal B Vaccine  Aged Out   COVID-19 Vaccine  Discontinued    Health Maintenance Items Addressed: Declines flu vaccine today. Agreeable to hep c screening today.  Declines colonoscopy  Additional Screening:  Vision Screening: Recommended annual ophthalmology exams for early  detection of glaucoma and other disorders of the eye. Is the patient up to date with their annual eye exam?  Yes  Who is the provider or what is the name of the office in which the patient attends annual eye exams? [facility], [facility]  Dental Screening: Recommended annual dental exams for proper oral hygiene  Community Resource Referral / Chronic Care Management: CRR required this visit?  No   CCM required this visit?  No   Plan:    I have personally reviewed and noted the following in the patient's chart:   Medical and social history Use of alcohol, tobacco or illicit drugs  Current medications and supplements including opioid prescriptions. Patient is not currently taking opioid prescriptions. Functional ability and status Nutritional status Physical activity Advanced directives List of other physicians Hospitalizations, surgeries, and ER visits in previous 12 months Vitals Screenings to include cognitive, depression, and falls Referrals and appointments  In addition, I have reviewed and discussed with patient certain preventive protocols, quality metrics, and best practice recommendations. A written personalized care plan for preventive services as well as general preventive health recommendations were provided to patient.   Lolita Libra, CMA   10/10/2024   After Visit Summary: (In Person-Printed) AVS printed and given to the patient  Notes: Nothing significant to report at this time.

## 2024-10-24 DIAGNOSIS — E669 Obesity, unspecified: Secondary | ICD-10-CM | POA: Diagnosis not present

## 2024-10-24 DIAGNOSIS — E785 Hyperlipidemia, unspecified: Secondary | ICD-10-CM | POA: Diagnosis not present

## 2024-10-24 DIAGNOSIS — R300 Dysuria: Secondary | ICD-10-CM | POA: Diagnosis not present

## 2024-10-24 DIAGNOSIS — E1169 Type 2 diabetes mellitus with other specified complication: Secondary | ICD-10-CM | POA: Diagnosis not present

## 2024-10-24 DIAGNOSIS — N183 Chronic kidney disease, stage 3 unspecified: Secondary | ICD-10-CM | POA: Diagnosis not present

## 2024-10-24 DIAGNOSIS — M549 Dorsalgia, unspecified: Secondary | ICD-10-CM | POA: Diagnosis not present

## 2024-10-24 DIAGNOSIS — R809 Proteinuria, unspecified: Secondary | ICD-10-CM | POA: Diagnosis not present

## 2024-10-24 DIAGNOSIS — I10 Essential (primary) hypertension: Secondary | ICD-10-CM | POA: Diagnosis not present

## 2024-10-31 DIAGNOSIS — G8929 Other chronic pain: Secondary | ICD-10-CM | POA: Diagnosis not present

## 2024-10-31 DIAGNOSIS — M545 Low back pain, unspecified: Secondary | ICD-10-CM | POA: Diagnosis not present

## 2024-11-02 ENCOUNTER — Ambulatory Visit: Payer: Self-pay | Admitting: Medical

## 2024-11-02 ENCOUNTER — Ambulatory Visit (HOSPITAL_BASED_OUTPATIENT_CLINIC_OR_DEPARTMENT_OTHER)
Admission: RE | Admit: 2024-11-02 | Discharge: 2024-11-02 | Disposition: A | Discharge status: Home / Self Care | Source: Ambulatory Visit | Attending: Medical | Admitting: Medical

## 2024-11-02 ENCOUNTER — Ambulatory Visit (INDEPENDENT_AMBULATORY_CARE_PROVIDER_SITE_OTHER): Admitting: Medical

## 2024-11-02 VITALS — BP 130/62 | HR 56 | Temp 97.6°F | Resp 15 | Ht 71.0 in | Wt 213.0 lb

## 2024-11-02 DIAGNOSIS — M25551 Pain in right hip: Secondary | ICD-10-CM | POA: Insufficient documentation

## 2024-11-02 DIAGNOSIS — M47817 Spondylosis without myelopathy or radiculopathy, lumbosacral region: Secondary | ICD-10-CM | POA: Diagnosis not present

## 2024-11-02 DIAGNOSIS — M5440 Lumbago with sciatica, unspecified side: Secondary | ICD-10-CM | POA: Diagnosis not present

## 2024-11-02 DIAGNOSIS — M1611 Unilateral primary osteoarthritis, right hip: Secondary | ICD-10-CM | POA: Diagnosis not present

## 2024-11-02 MED ORDER — TRAMADOL HCL 50 MG PO TABS: ORAL_TABLET | ORAL | 0 refills | Status: AC

## 2024-11-02 MED ORDER — CYCLOBENZAPRINE HCL 5 MG PO TABS: 5.0000 mg | ORAL_TABLET | Freq: Every day | ORAL | 0 refills | Status: AC

## 2024-11-02 NOTE — Patient Instructions (Signed)
 Right hip pain Right hip pain exacerbated by standing and movement. Differential includes hip joint pathology versus referred lumbar pain. MRI showed moderate disc bulge and central stenosis. back in 2016- Ordered x-ray of right hip. - Prescribed Flexeril 3 tablets, one tablet at night for 2-3 nights. Rx advisement explained extensively. - Prescribed tramadol 6 tablets q 6 hour prn for constant pain if occurs. - Advised Tylenol 500 mg every 6 hours as needed for pain. -nsaids and steroid not recommended due to ckd and diabetes.  Chronic low back pain Chronic lumbar pain without sciatica. Previous MRI showed moderate disc bulge and central stenosis. Neurosurgeon noted chronic midline pain without expressed nerve involvement. - Ordered x-ray of lumbar spine. - Referred to physical therapy for core strengthening and back pain exercises. - Advised on healthy low back living. - Instructed to monitor for new symptoms such as leg pain, tingling, or numbness.(if that were to occur consider repeat mri and refer back to neurosurgeon  Follow up date to be determined after xray review

## 2024-11-02 NOTE — Progress Notes (Signed)
 Subjective:    Patient ID: Craig Baldwin, male    DOB: December 01, 1951, 73 y.o.   MRN: [id number]  HPI Discussed the use of AI scribe software for clinical note transcription with the patient, who gave verbal consent to proceed.  History of Present Illness   Craig Baldwin is a 73 year old male with a history of back surgeries who presents with right hip and lower back pain.  He experiences pain in his right hip and lower back, which intensifies with movements such as standing for prolonged periods or walking. The pain is described as a 'grabbing' sensation that occurs even with minimal movements, such as lifting a leg or turning over in bed, often waking him and his wife at night due to its severity.  He has a history of back surgeries, the first in the 1990s and the second about ten years ago, for a deteriorating disc. An MRI from 2016 showed moderate disc bulge from L1 through L3 and L4, L5 with some central stenosis. Despite these findings, he does not experience pain radiating down his leg or numbness.  The pain is not constant and does not occur when sitting still. However, activities such as walking for extended periods, like during a recent trip to [place], exacerbate the pain to the point where he struggles to walk. He also mentions difficulty with tasks like putting on pants due to the pain.   His social history includes working as a naval architect, although he is currently off work due to his back issues. He mentions that bouncing around in the truck is not beneficial for his condition.   He is off work for at least a week since he is a naval architect.  No pain shooting down his leg, numbness between his legs, or weakness. He reports severe pain with minimal movements, particularly at night.        Review of Systems  See hpi      Objective:   Physical Exam  General Appearance- Not in acute distress.    Chest and Lung Exam Auscultation: Breath sounds:-Normal. Clear even  and unlabored. Adventitious sounds:- No Adventitious sounds.  Cardiovascular Auscultation:Rythm - Regular, rate and rythm. Heart Sounds -Normal heart sounds.  Abdomen Inspection:-Inspection Normal.  Palpation/Perucssion: Palpation and Percussion of the abdomen reveal- Non Tender, No Rebound tenderness, No rigidity(Guarding) and No Palpable abdominal masses.  Liver:-Normal.  Spleen:- Normal.   Back Rt side si spine tenderness to palpation. Pain on straight leg lift. Pain on lateral movements and flexion/extension of the spine.  Lower ext neurologic  L5-S1 sensation intact bilaterally. Normal patellar reflexes bilaterally. No foot drop bilaterally.   Rt hip- no pain to hip on rom but note pain in rt si area on hip rom.    Assessment & Plan:   Right hip pain Right hip pain exacerbated by standing and movement. Differential includes hip joint pathology versus referred lumbar pain. MRI showed moderate disc bulge and central stenosis. back in 2016- Ordered x-ray of right hip. - Prescribed Flexeril 3 tablets, one tablet at night for 2-3 nights. Rx advisement explained extensively. - Prescribed tramadol 6 tablets q 6 hour prn for constant pain if occurs. - Advised Tylenol 500 mg every 6 hours as needed for pain. -nsaids and steroid not recommended due to ckd and diabetes.  Chronic low back pain Chronic lumbar pain without sciatica. Previous MRI showed moderate disc bulge and central stenosis. Neurosurgeon noted chronic midline pain without expressed nerve involvement. - Ordered x-ray of  lumbar spine. - Referred to physical therapy for core strengthening and back pain exercises. - Advised on healthy low back living. - Instructed to monitor for new symptoms such as leg pain, tingling, or numbness.(if that were to occur consider repeat mri and refer back to neurosurgeon  Follow up date to be determined after xray review

## 2024-11-04 ENCOUNTER — Telehealth: Payer: Self-pay | Admitting: Medical

## 2024-11-04 NOTE — Telephone Encounter (Signed)
 Copied from CRM #[id number]. Topic: General - Other >> Nov 04, 2024  1:41 PM Drema MATSU wrote: Reason for CRM: Patient's job is needing a doctor's note stating how long he will be out of work. Patient states that job is needing the note today. Patient thinks he wants to go back to work next Tuesday. If he is not feeling better he wants the date extended. His job is also needing a doctors note for the days he missed as well.  Fax: [phone] ATTN To: Vidal Kenner

## 2024-11-07 ENCOUNTER — Telehealth: Payer: Self-pay | Admitting: Medical

## 2024-11-07 ENCOUNTER — Telehealth: Payer: Self-pay

## 2024-11-07 ENCOUNTER — Telehealth: Payer: Self-pay | Admitting: Nutrition

## 2024-11-07 NOTE — Telephone Encounter (Signed)
 Copied from CRM #[id number]. Topic: General - Other >> Nov 07, 2024  8:24 AM Cleave MATSU wrote: Reason for CRM: pt said he seen the results from the xray but he wants to know if he can get a MRI because he's still having problems

## 2024-11-07 NOTE — Addendum Note (Signed)
 Addended by: DORINA DALLAS HERO on: 11/07/2024 09:33 PM   Modules accepted: Orders

## 2024-11-07 NOTE — Telephone Encounter (Signed)
 Copied from CRM #[id number]. Topic: General - Other >> Nov 07, 2024  3:33 PM Ashley R wrote: Reason for CRM: calling back about Note needed for being out of work Employer agreed End date can be left as  to be determined. Fax [phone] ATTN Nena Kenner

## 2024-11-07 NOTE — Telephone Encounter (Signed)
 LVM .  Pt. Wanting to know if his Ozempic  is here for him to pick up.  Please call: [phone]

## 2024-11-08 NOTE — Telephone Encounter (Signed)
 Patient picked up patient assistance - Log Noted

## 2024-11-08 NOTE — Telephone Encounter (Signed)
 Notified pt of pcp thoughts he was understanding gave him sports medicines number and also faxed off work note

## 2024-11-08 NOTE — Telephone Encounter (Signed)
 Printed work note called pt and notified him that it will be placed up front in office he then asked me to fax it over will fax

## 2024-11-11 NOTE — Progress Notes (Unsigned)
 [facility] [address] [place] [address] Phone: [phone]   Assessment and Plan:     1. Chronic bilateral low back pain with right-sided sciatica (Primary) 2. Degeneration of intervertebral disc of lumbar region with discogenic back pain and lower extremity pain 3.  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  I do not recommend patient drive until symptoms can be better controlled.  Note provided that patient should be out of work for 3 weeks or until reevaluated - Recommend further evaluation with lumbar MRI due to right lower extremity weakness, red flags of giving out and instability, pain with day-to-day activities, pain >6/10, high risk for falls, history of lumbar surgery -Do not recommend p.o. NSAIDs with anticoagulation on Eliquis  - Recommend limiting prednisone  with past medical history DM type II.  Could prescribe prednisone  Dosepak if pain worsens - Could prescribe methocarbamol if muscle spasms return.  Would not recommend driving while taking muscle relaxers - Start HEP for low back  15 additional minutes spent for educating Therapeutic Home Exercise Program.  This included exercises focusing on stretching, strengthening, with focus on eccentric aspects.   Long term goals include an improvement in range of motion, strength, endurance as well as avoiding reinjury. Patient's frequency would include in 1-2 times a day, 3-5 times a week for a duration of 6-12 weeks. Proper technique shown and discussed handout in great  detail with ATC.  All questions were discussed and answered.    Pertinent previous records reviewed include low back x-ray 11/02/2024, hip x-ray 11/02/2024   Follow Up: 1 week after MRI to review results and discuss treatment plan.  Could consider epidural CSI.   Subjective:   I, Craig Baldwin, am serving as a neurosurgeon for Craig Baldwin  Chief Complaint: right hip and low back pain   HPI:   11/16/2024 Patient is a 73 year old with right hip and low back pain. Patient states pain started years ago he was seen by PCP 11/12/2025[name] is a 73 year old male with a history of back surgeries who presents with right hip and lower back pain.   He experiences pain in his right hip and lower back, which intensifies with movements such as standing for prolonged periods or walking. The pain is described as a 'grabbing' sensation that occurs even with minimal movements, such as lifting a leg or turning over in bed, often waking him and his wife at night due to its severity.   He has a history of back surgeries, the first in the 1990s and the second about ten years ago, for a deteriorating disc. An MRI from 2016 showed moderate disc bulge from L1 through L3 and L4, L5 with some central stenosis. Despite these findings, he does not experience pain radiating down his leg or numbness.   The pain is not constant and does not occur when sitting still. However, activities such as walking for extended periods, like during a recent trip to [place], exacerbate the pain to the point where he struggles to walk. He also mentions difficulty with tasks like putting on pants due to the pain.  Notes he feels like he is going to fall due to his leg giving out. Tylenol 650 mg  2x tablets seemed to help the pain. Decreased ROM. When he moves his leg he has a grab sensation in his low back right side. 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   Current Outpatient Medications:    apixaban  (ELIQUIS ) 2.5 MG TABS tablet, Take 1 tablet (2.5 mg total) by mouth 2 (two) times daily., Disp: 180 tablet, Rfl: 3   benazepril  (LOTENSIN ) 10 MG tablet, Take 1 tablet by mouth once daily, Disp: 90 tablet, Rfl: 0   cholecalciferol (VITAMIN D3) 25 MCG (1000 UNIT) tablet, Take 1,000 Units by mouth daily., Disp: , Rfl:    cyclobenzaprine  (FLEXERIL ) 5 MG tablet, Take 1 tablet (5 mg total) by mouth at bedtime., Disp: 3 tablet, Rfl: 0   empagliflozin  (JARDIANCE ) 25 MG TABS tablet, Take 1 tablet (25 mg total) by mouth daily before breakfast., Disp: 30 tablet, Rfl: 11   rosuvastatin  (CRESTOR ) 20 MG tablet, Take 1 tablet by mouth once daily, Disp: 90 tablet, Rfl: 3   Semaglutide , 1 MG/DOSE, 4 MG/3ML SOPN, Inject 1 mg as directed once a week., Disp: , Rfl:    tamsulosin  (FLOMAX ) 0.4 MG CAPS capsule, TAKE 1 CAPSULE BY MOUTH ONCE DAILY AFTER SUPPER, Disp: 90 capsule, Rfl: 0   traMADol  (ULTRAM ) 50 MG tablet, 1 tab po q 6 hours prn pain, Disp: 4 tablet, Rfl: 0   triamcinolone ointment (KENALOG) 0.1 %, Apply topically 2 (two) times daily as needed., Disp: , Rfl:    Objective:     Vitals:   11/16/24 0827  BP: 130/70  Pulse: 72  SpO2: 97%  Weight: 212 lb (96.2 kg)  Height: 5' 11 (1.803 m)      Body mass index is 29.57 kg/m.    Physical Exam:    Gen: Appears well, nad, nontoxic and pleasant Psych: Alert and oriented, appropriate mood and affect Neuro: Overall mildly decreased sensation in right lower extremity compared to left.  Otherwise sensation intact, strength is 4/5 in right lower extremity compared to 5/5 in left lower extremity, muscle tone wnl Skin: no susupicious lesions or rashes  Back - Normal skin, Spine with normal alignment and no deformity.   No tenderness to vertebral process palpation.   Lumbar paraspinous muscles are   tender and without  spasm NTTP gluteal musculature Straight leg raise positive right Trendelenberg positive right Piriformis Test negative Gait antalgic, favoring left leg Restricted flexion, extension, rotation  Electronically signed by:  Odis Baldwin D.CLEMENTEEN [facility] 8:51 AM 11/16/24

## 2024-11-16 ENCOUNTER — Ambulatory Visit: Admitting: Sports Medicine

## 2024-11-16 VITALS — BP 130/70 | HR 72 | Ht 71.0 in | Wt 212.0 lb

## 2024-11-16 DIAGNOSIS — M51362 Other intervertebral disc degeneration, lumbar region with discogenic back pain and lower extremity pain: Secondary | ICD-10-CM | POA: Diagnosis not present

## 2024-11-16 DIAGNOSIS — G8929 Other chronic pain: Secondary | ICD-10-CM | POA: Diagnosis not present

## 2024-11-16 DIAGNOSIS — Z9181 History of falling: Secondary | ICD-10-CM

## 2024-11-16 NOTE — Patient Instructions (Addendum)
 MRI low back   Work note provided   Tylenol [phone] mg 2-3 times a day for pain relief   If pain is severe call and ask for a prednisone  dos pak  If muscle spasm return call and ask for muscle relaxer robaxin   Follow up 1 week after MRI to discuss results

## 2024-11-18 ENCOUNTER — Other Ambulatory Visit: Payer: Self-pay | Admitting: Medical

## 2024-11-21 NOTE — Telephone Encounter (Signed)
 Rx sent on 11/19/24

## 2024-11-21 NOTE — Telephone Encounter (Signed)
 Initial Comment Caller states he has one pill for tonight and is calling for a prescription refill. He is not having any symptoms. Translation No Disp. Time Titus Time) Disposition Final User 11/18/2024 1:59:55 PM General Information Provided Yes Scherry Serve Final Disposition 11/18/2024 1:59:55 PM General Information Provided Yes Sianez, Sirena

## 2024-11-30 ENCOUNTER — Other Ambulatory Visit: Payer: Self-pay | Admitting: Sports Medicine

## 2024-11-30 ENCOUNTER — Telehealth: Payer: Self-pay | Admitting: Sports Medicine

## 2024-11-30 MED ORDER — TIZANIDINE HCL 4 MG PO TABS: 4.0000 mg | ORAL_TABLET | Freq: Two times a day (BID) | ORAL | 1 refills | Status: AC | PRN

## 2024-11-30 NOTE — Telephone Encounter (Signed)
 Zanaflex placed

## 2024-11-30 NOTE — Progress Notes (Signed)
 zanaflex 4-8mg  every 12 hours for pain and muscle spasms. 30 tab, 1 RF

## 2024-11-30 NOTE — Telephone Encounter (Signed)
 Patient asked if Dr Leonce would be able to send in something for him for pain? He is scheduled for an MRI on Sunday.

## 2024-12-04 ENCOUNTER — Inpatient Hospital Stay: Admission: RE | Admit: 2024-12-04 | Discharge: 2024-12-04 | Attending: Sports Medicine

## 2024-12-04 DIAGNOSIS — M51362 Other intervertebral disc degeneration, lumbar region with discogenic back pain and lower extremity pain: Secondary | ICD-10-CM

## 2024-12-04 DIAGNOSIS — G8929 Other chronic pain: Secondary | ICD-10-CM

## 2024-12-05 ENCOUNTER — Other Ambulatory Visit: Payer: Self-pay | Admitting: Sports Medicine

## 2024-12-05 MED ORDER — METHYLPREDNISOLONE 4 MG PO TBPK: ORAL_TABLET | ORAL | 0 refills | Status: AC

## 2024-12-05 NOTE — Progress Notes (Signed)
 Dos pak has been placed

## 2024-12-05 NOTE — Telephone Encounter (Signed)
 Patient called back stating like his hips and mid back are very painful. He does not feel like the muscle relaxer is helping.

## 2024-12-05 NOTE — Telephone Encounter (Signed)
 Placed dos pak to pharmacy and messaged via mychart

## 2024-12-06 ENCOUNTER — Ambulatory Visit: Payer: Self-pay

## 2024-12-06 NOTE — Telephone Encounter (Signed)
 FYI Only or Action Required?: Action required by provider: medication refill request. Pt declines appt. Requesting to have pain medication prescribed. Pharmacy below:   [id number] - [place], [place] - [address]    Patient was last seen in primary care on 11/02/2024 by Dorina Loving, PA-C.  Called Nurse Triage reporting Hip Pain.  Symptoms began several months ago.  Interventions attempted: OTC medications: Tylenol and Prescription medications: Tramadol .  Symptoms are: gradually worsening.  Triage Disposition: See PCP Within 2 Weeks  Patient/caregiver understands and will follow disposition?: No, wishes to speak with PCP  Pt reports ongoing right and left hip pain, worse on the right x2 months. Pain worse when sitting, standing and walking. Gets up to 10/10, sharp pain. Seen by PCP on 11/12 for hip pain. Having some difficulty walking. Denies numbness or tingling. Referred to [facility] sports medicine, seen on 11/26. Ordered MRI which has been completed, results not available yet. Pain has not changed, pt asking to have pain med prescribed by PCP. Pt states sports med will not prescribe pain medicine. Only prescribed prednisone , pt has not picked this up yet.  Offered to schedule soonest appt with PCP 12/18. Pt declines. States he was just there a month ago and nothing has changed. Forwarding request for pain medicine to clinical team, advised UC or ED for worsening symptoms. Pt states he has access to mychart, advised to monitor mychart for message from team or a phone call.    Reason for Disposition  Hip pain is a chronic symptom (recurrent or ongoing AND present > 4 weeks)  Answer Assessment - Initial Assessment Questions 1. LOCATION and RADIATION: Where is the pain located? Does the pain spread (shoot) anywhere else?     Both hips. Worse in right hip. Lower back.  2. QUALITY: What does the pain feel like?  (e.g., sharp, dull, aching, burning)     Sharp pain  in right hip. Feels like bone on bone.  3. SEVERITY: How bad is the pain? What does it keep you from doing?   (Scale 1-10; or mild, moderate, severe)     Gets up to 10/10 when sitting, standing and walking.  4. ONSET: When did the pain start? Does it come and go, or is it there all the time?     2 months ago.  5. WORK OR EXERCISE: Has there been any recent work or exercise that involved this part of the body?      Denies.  6. CAUSE: What do you think is causing the hip pain?      Unsure. Pt waiting on MRI results. Has had 2 back surgeries years ago that may be related.  7. AGGRAVATING FACTORS: What makes the hip pain worse? (e.g., walking, climbing stairs, running)     Sitting, standing and walking.  8. OTHER SYMPTOMS: Do you have any other symptoms? (e.g., back pain, pain shooting down leg,  fever, rash)     Mid/low back pain. Some difficulty walking.  Protocols used: Hip Pain-A-AH

## 2024-12-06 NOTE — Telephone Encounter (Signed)
 This RN made first attempt to contact pt with no answer. A vm was left with call back number   Copied from CRM #[id number]. Topic: Clinical - Red Word Triage >> Dec 05, 2024  4:53 PM Craig Baldwin wrote: Red Word that prompted transfer to Nurse Triage:Pt is in excruciating pain in hips , can barely walk. Asking if his provider will send over pain medication. >> Dec 05, 2024  5:21 PM Craig Baldwin wrote: Patient would like a call back.

## 2024-12-08 ENCOUNTER — Ambulatory Visit: Payer: Self-pay | Admitting: Sports Medicine

## 2024-12-08 NOTE — Addendum Note (Signed)
 Addended by: DORINA DALLAS HERO on: 12/08/2024 07:00 PM   Modules accepted: Orders

## 2024-12-09 NOTE — Telephone Encounter (Signed)
 Called pt and notified him of rx and pcp advice. Also scheduled pt an apt for 12/23/2024

## 2024-12-09 NOTE — Progress Notes (Unsigned)
 "               [facility] [address] [place] [address] Phone: [phone]   Assessment and Plan:     1. Chronic bilateral low back pain with right-sided sciatica (Primary) 2. Spinal stenosis, lumbar region, with neurogenic claudication 3. Risk for falls -Chronic with exacerbation, subsequent visit - Most consistent with degenerative changes in lumbar spine, lumbar stenosis contributing to low back pain and radiculopathy. - Recommend epidural CSI to right sided L1-L2 based on patient's radicular distribution to anterior thigh - Recommend limiting p.o. prednisone  with past medical history of DM type II - May use tizanidine  4-8 mg nightly as needed for muscle spasms.  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  Returning to driving before that time could lead to endangerment of patient or others based on patient's radicular symptoms, weakness.    Pertinent previous records reviewed include lumbar MRI 12/04/2024 IMPRESSION: 1. At L1-2 there is a mild disc bulge with a small right paracentral disc protrusion contacting the right L2 nerve root. Mild bilateral facet arthropathy. Mild central canal stenosis. Mild bilateral foraminal stenosis. 2. At L2-3 there is a mild disc bulge. Moderate bilateral facet arthropathy. Bilateral lateral recess stenosis. Mild central canal stenosis. Mild bilateral foraminal stenosis. 3. At L3-4 there is a mild disc bulge. Moderate bilateral facet arthropathy with ligament flavum thickening. Moderate central canal stenosis. Bilateral lateral recess stenosis. Severe left and moderate right foraminal stenosis. 4. At L4-5 there is a mild disc bulge. Moderate bilateral facet arthropathy. Bilateral lateral recess stenosis. Mild central canal stenosis. Prior laminectomy.  Severe right and moderate right foraminal stenosis. 5. At L5-S1 there is acquired fusion. Left laminectomy. Moderate bilateral facet arthropathy. No right foraminal stenosis. Mild left foraminal stenosis. No central canal stenosis.   Follow Up: 2 weeks after epidural.  Could consider repeat epidural if necessary.  Could consider epidural CSI to lower level if pain remains.   Subjective:   I, Craig Baldwin, am serving as a neurosurgeon for Craig Baldwin   Chief Complaint: right hip and low back pain    HPI:    11/16/2024 Patient is a 73 year old with right hip and low back pain. Patient states pain started years ago he was seen by PCP 11/12/2025[name] is a 73 year old male with a history of back surgeries who presents with right hip and lower back pain.   He experiences pain in his right hip and lower back, which intensifies with movements such as standing for prolonged periods or walking. The pain is described as a 'grabbing' sensation that occurs even with minimal movements, such as lifting a leg or turning over in bed, often waking him and his wife at night due to its severity.   He has a history of back surgeries, the first in the 1990s and the second about ten years ago, for a deteriorating disc. An MRI from 2016 showed moderate disc bulge from L1 through L3 and L4, L5 with some central stenosis. Despite these findings, he does not experience pain radiating down his leg or numbness.   The pain is not constant and does not occur when sitting still. However, activities such as walking for extended periods, like during a recent trip to [place], exacerbate the pain  to the point where he struggles to walk. He also mentions difficulty with tasks like putting on pants due to the pain.     Notes he feels like he is going to fall due to his leg giving out. Tylenol 650 mg  2x tablets seemed to help the pain. Decreased ROM. When he moves his leg he has a grab sensation in his low  back right side. 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  Current Medications[1]   Objective:     Vitals:   12/12/24 1259  BP: 106/76  Pulse: 71  SpO2: 99%  Weight: 204 lb (92.5 kg)  Height: 5' 11 (1.803 m)      Body mass index is 28.45 kg/m.    Physical Exam:    Gen: Appears well, nad, nontoxic and pleasant Psych: Alert and oriented, appropriate mood and affect Neuro: Overall mildly decreased sensation in right lower extremity compared to left most pronounced over anterior thigh and lateral hip.  Otherwise sensation intact, strength is 4/5 in right lower extremity compared to 5/5 in left lower extremity, muscle tone wnl Skin: no susupicious lesions or rashes   Back - Normal skin, Spine with normal alignment and no deformity.   No tenderness to vertebral process palpation.   Lumbar paraspinous muscles are   tender and without spasm NTTP gluteal musculature Straight leg raise positive right Trendelenberg positive right Piriformis Test negative Gait antalgic, favoring left leg Restricted flexion, extension, rotation    Electronically signed by:  [facility] 1:43 PM 12/12/2024     [1]  Current Outpatient Medications:    apixaban  (ELIQUIS ) 2.5 MG TABS tablet, Take 1 tablet (2.5 mg total) by mouth 2 (two) times daily., Disp: 180 tablet, Rfl: 3   benazepril  (LOTENSIN ) 10 MG tablet, Take 1 tablet by mouth once daily, Disp: 90 tablet, Rfl: 0   cholecalciferol (VITAMIN D3) 25 MCG (1000 UNIT) tablet, Take 1,000 Units by mouth daily., Disp: , Rfl:    cyclobenzaprine  (FLEXERIL ) 5 MG tablet, Take 1 tablet (5 mg total) by mouth at bedtime., Disp: 3 tablet, Rfl: 0   empagliflozin  (JARDIANCE ) 25 MG TABS tablet, Take 1 tablet (25 mg total) by mouth  daily before breakfast., Disp: 30 tablet, Rfl: 11   methylPREDNISolone  (MEDROL  DOSEPAK) 4 MG TBPK tablet, Take 6 tablets on day 1.  Take 5 tablets on day 2.  Take 4 tablets on day 3.  Take 3 tablets on day 4.  Take 2 tablets on day 5.  Take 1 tablet on day 6., Disp: 21 tablet, Rfl: 0   rosuvastatin  (CRESTOR ) 20 MG tablet, Take 1 tablet by mouth once daily, Disp: 90 tablet, Rfl: 3   Semaglutide , 1 MG/DOSE, 4 MG/3ML SOPN, Inject 1 mg as directed once a week., Disp: , Rfl:    tamsulosin  (FLOMAX ) 0.4 MG CAPS capsule, TAKE 1 CAPSULE BY MOUTH ONCE DAILY AFTER SUPPER, Disp: 90 capsule, Rfl: 0   tiZANidine  (ZANAFLEX ) 4 MG tablet, Take 1 tablet (4 mg total) by mouth every 12 (twelve) hours as needed., Disp: 30 tablet, Rfl: 1   traMADol  (ULTRAM ) 50 MG tablet, 1 tab po q 6 hours prn pain, Disp: 4 tablet, Rfl: 0   traMADol  (ULTRAM ) 50 MG tablet, Take 1 tablet (50 mg total) by mouth every 8 (eight) hours as  needed for up to 5 days., Disp: 8 tablet, Rfl: 0   triamcinolone ointment (KENALOG) 0.1 %, Apply topically 2 (two) times daily as needed., Disp: , Rfl:   "

## 2024-12-12 ENCOUNTER — Ambulatory Visit: Admitting: Sports Medicine

## 2024-12-12 VITALS — BP 106/76 | HR 71 | Ht 71.0 in | Wt 204.0 lb

## 2024-12-12 DIAGNOSIS — M48062 Spinal stenosis, lumbar region with neurogenic claudication: Secondary | ICD-10-CM | POA: Diagnosis not present

## 2024-12-12 DIAGNOSIS — G8929 Other chronic pain: Secondary | ICD-10-CM

## 2024-12-12 DIAGNOSIS — M5441 Lumbago with sciatica, right side: Secondary | ICD-10-CM

## 2024-12-12 DIAGNOSIS — Z9181 History of falling: Secondary | ICD-10-CM | POA: Diagnosis not present

## 2024-12-12 NOTE — Patient Instructions (Signed)
 Epidural right L1-2  Follow up 2 weeks after to discuss results

## 2024-12-21 NOTE — Telephone Encounter (Signed)
 Forwarding to Dr. Leonce and Abram.

## 2024-12-23 ENCOUNTER — Ambulatory Visit: Admitting: Medical

## 2024-12-26 NOTE — Telephone Encounter (Signed)
 Messaged patient with phone number to schedule

## 2024-12-28 ENCOUNTER — Ambulatory Visit: Payer: Self-pay

## 2024-12-28 NOTE — Telephone Encounter (Signed)
 FYI Only or Action Required?: Action required by provider: clinical question for provider and update on patient condition.  Patient was last seen in primary care on 11/02/2024 by Dorina Loving, PA-C.  Called Nurse Triage reporting Recurrent Skin Infections.  Symptoms began yesterday.  Interventions attempted: OTC medications: neosporin.  Symptoms are: gradually worsening.  Triage Disposition: See PCP When Office is Open (Within 3 Days)  Patient/caregiver understands and will follow disposition?: No, wishes to speak with PCP    Message from Alfonso HERO sent at 12/28/2024  3:40 PM EST  Reason for Triage: patient has a boil on his back and its painful and getting bigger. Asking for antibiotics.   Reason for Disposition  Boil > 1/2 inch across (> 12 mm; larger than a marble)  Answer Assessment - Initial Assessment Questions Pt called in requesting abx for possible boil vs cyst on his back; pt reports it has been present for years but site is now raised  and hurts when it is touched. Pt did f/u with Dermatology but unable to see him until 01/06/25. Pt requesting abx to settle site until appt. He has been applying occlusive bandage with neosporin. Discussed switching to vaseline as neosporin has antifungal property and could cause further skin irritation. Discussed if site does come to a head or start to drain to clean site and pt can apply warm compress to aid in symptom relief. Pt voiced understanding.    1. APPEARANCE of BOIL: What does the boil look like?      Raised red boil; smaller than a quarter, been there for years   2. LOCATION: Where is the boil located?      Midback   3. NUMBER: How many boils are there?      1   4. SIZE: How big is the boil? (e.g., inches, cm; compare to size of a coin or other object)     Smaller than a quarter   5. ONSET: When did the boil start?     Been present for years but yesterday started to bother him and noticed it was raised   6.  PAIN: Is there any pain? If Yes, ask: How bad is the pain?   (Scale 1-10; or mild, moderate, severe)     Yes; only when touching or if spot gets hit. Pt reports no pain without aggravation   7. FEVER: Do you have a fever? If Yes, ask: What is it, how was it measured, and when did it start?      No   8. SOURCE: Have you been around anyone with boils or other Staph infections? Have you ever had boils before?     No   9. OTHER SYMPTOMS: Do you have any other symptoms? (e.g., shaking chills, weakness, rash elsewhere on body)     No  Protocols used: Boil (Skin Abscess)-A-AH

## 2024-12-30 MED ORDER — DOXYCYCLINE HYCLATE 100 MG PO TABS: 100.0000 mg | ORAL_TABLET | Freq: Two times a day (BID) | ORAL | 0 refills | Status: AC

## 2024-12-30 NOTE — Addendum Note (Signed)
 Addended by: DORINA DALLAS DORINA PA-C M on: 12/30/2024 12:11 PM   Modules accepted: Orders

## 2025-01-02 NOTE — Discharge Instructions (Signed)

## 2025-01-03 ENCOUNTER — Inpatient Hospital Stay: Admission: RE | Admit: 2025-01-03 | Discharge: 2025-01-03 | Attending: Sports Medicine

## 2025-01-03 DIAGNOSIS — Z9181 History of falling: Secondary | ICD-10-CM

## 2025-01-03 DIAGNOSIS — G8929 Other chronic pain: Secondary | ICD-10-CM

## 2025-01-03 DIAGNOSIS — M48062 Spinal stenosis, lumbar region with neurogenic claudication: Secondary | ICD-10-CM

## 2025-01-03 MED ORDER — METHYLPREDNISOLONE ACETATE 40 MG/ML INJ SUSP (RADIOLOG
80.0000 mg | Freq: Once | INTRAMUSCULAR | Status: AC
  Administered 2025-01-03: 80 mg via EPIDURAL

## 2025-01-03 MED ORDER — IOPAMIDOL (ISOVUE-M 200) INJECTION 41%
1.0000 mL | Freq: Once | INTRAMUSCULAR | Status: AC
  Administered 2025-01-03: 1 mL via EPIDURAL

## 2025-01-04 ENCOUNTER — Encounter: Payer: Self-pay | Admitting: Internal Medicine

## 2025-01-06 ENCOUNTER — Telehealth: Payer: Self-pay | Admitting: Sports Medicine

## 2025-01-06 ENCOUNTER — Encounter: Payer: Self-pay | Admitting: Sports Medicine

## 2025-01-06 NOTE — Telephone Encounter (Signed)
Note sent to fax number provided.

## 2025-01-06 NOTE — Telephone Encounter (Signed)
 Pt had epidural 01/03/2025, scheduled f/u here on 01/18/2025. 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 His employer needs an OOW note from 12/23/2024-recheck? Please fax work note to [employer] at [phone].

## 2025-01-17 NOTE — Progress Notes (Unsigned)
 "               [facility] [address] [place] [address] Phone: [phone]   Assessment and Plan:     1. Chronic bilateral low back pain with right-sided sciatica (Primary) 2. Spinal stenosis, lumbar region, with neurogenic claudication 3. Risk for falls -Chronic with exacerbation, subsequent visit - Still consistent with degenerative changes in lumbar spine leading to lumbar stenosis and lumbar radiculopathy - Patient had improvement in back pain and lateral hip pain after epidural CSI to right sided on 01/03/2025, however no improvement in radicular symptoms in the right lower extremity.  I suspect we have successfully decreased inflammation, neurologic impingement at right-sided L1-L2, however I suspect there is still significant symptomatology stemming from stenosis and degenerative changes at right sided L4-L5.  Recommend epidural CSI to right sided L4-L5.  Order placed - Recommend no lifting >15 pounds for 4 weeks or until reevaluated.  Work note provided.  Can fill out/Extend additional paperwork if necessary. -May use tizanidine  4 to 8 mg nightly as needed for muscle spasms.  Do not use muscle relaxers and drive  Pertinent previous records reviewed include epidural procedure note   Follow Up: 2 weeks after epidural to review benefit.  If no improvement or worsening of symptoms, would consider neurosurgery referral   Subjective:   I, Craig Baldwin, am serving as a neurosurgeon for Doctor Craig Baldwin   Chief Complaint: right hip and low back pain    HPI:    11/16/2024 Patient is a 74 year old with right hip and low back pain. Patient states pain started years ago he was seen by PCP 11/12/2025[name] is a 74 year old male with a history of back surgeries who presents with right hip and lower back pain.   He experiences pain in his right hip and lower back, which intensifies with movements such as standing for prolonged  periods or walking. The pain is described as a 'grabbing' sensation that occurs even with minimal movements, such as lifting a leg or turning over in bed, often waking him and his wife at night due to its severity.   He has a history of back surgeries, the first in the 1990s and the second about ten years ago, for a deteriorating disc. An MRI from 2016 showed moderate disc bulge from L1 through L3 and L4, L5 with some central stenosis. Despite these findings, he does not experience pain radiating down his leg or numbness.   The pain is not constant and does not occur when sitting still. However, activities such as walking for extended periods, like during a recent trip to [place], exacerbate the pain to the point where he struggles to walk. He also mentions difficulty with tasks like putting on pants due to the pain.     Notes he feels like he is going to fall due to his leg giving out. Tylenol 650 mg  2x tablets seemed to help the pain. Decreased ROM. When he moves his leg he has a grab sensation in his low back right side. Pain is intermittent    12/12/2024 Patient states he is no different  01/18/2025 Patient states he is the same. 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  Current Medications[1]   Objective:     Vitals:  01/18/25 0945  Pulse: 73  SpO2: 97%  Weight: 207 lb (93.9 kg)  Height: 5' 11 (1.803 m)      Body mass index is 28.87 kg/m.    Physical Exam:    Gen: Appears well, nad, nontoxic and pleasant Psych: Alert and oriented, appropriate mood and affect Neuro: Overall mildly decreased sensation in right lower extremity compared to left most pronounced over anterior thigh and lateral hip.  Otherwise sensation intact, strength is 4/5 in right lower extremity compared to 5/5 in left lower extremity, muscle tone  wnl Skin: no susupicious lesions or rashes   Back - Normal skin, Spine with normal alignment and no deformity.   No tenderness to vertebral process palpation.   Lumbar paraspinous muscles are   tender and without spasm NTTP gluteal musculature Straight leg raise positive right Trendelenberg positive right Piriformis Test negative Gait antalgic, favoring left leg Restricted flexion, extension, rotation      Electronically signed by:  [facility] 10:06 AM 01/18/25     [1]  Current Outpatient Medications:    apixaban  (ELIQUIS ) 2.5 MG TABS tablet, Take 1 tablet (2.5 mg total) by mouth 2 (two) times daily., Disp: 180 tablet, Rfl: 3   benazepril  (LOTENSIN ) 10 MG tablet, Take 1 tablet by mouth once daily, Disp: 90 tablet, Rfl: 0   cholecalciferol (VITAMIN D3) 25 MCG (1000 UNIT) tablet, Take 1,000 Units by mouth daily., Disp: , Rfl:    cyclobenzaprine  (FLEXERIL ) 5 MG tablet, Take 1 tablet (5 mg total) by mouth at bedtime., Disp: 3 tablet, Rfl: 0   doxycycline  (VIBRA -TABS) 100 MG tablet, Take 1 tablet (100 mg total) by mouth 2 (two) times daily., Disp: 14 tablet, Rfl: 0   empagliflozin  (JARDIANCE ) 25 MG TABS tablet, Take 1 tablet (25 mg total) by mouth daily before breakfast., Disp: 30 tablet, Rfl: 11   methylPREDNISolone  (MEDROL  DOSEPAK) 4 MG TBPK tablet, Take 6 tablets on day 1.  Take 5 tablets on day 2.  Take 4 tablets on day 3.  Take 3 tablets on day 4.  Take 2 tablets on day 5.  Take 1 tablet on day 6., Disp: 21 tablet, Rfl: 0   rosuvastatin  (CRESTOR ) 20 MG tablet, Take 1 tablet by mouth once daily, Disp: 90 tablet, Rfl: 3   Semaglutide , 1 MG/DOSE, 4 MG/3ML SOPN, Inject 1 mg as directed once a week., Disp: , Rfl:    tamsulosin  (FLOMAX ) 0.4 MG CAPS capsule, TAKE 1 CAPSULE BY MOUTH ONCE DAILY AFTER SUPPER, Disp: 90 capsule, Rfl: 0   tiZANidine  (ZANAFLEX ) 4 MG tablet, Take 1 tablet (4 mg total) by mouth every 12 (twelve) hours as needed., Disp: 30  tablet, Rfl: 1   traMADol  (ULTRAM ) 50 MG tablet, 1 tab po q 6 hours prn pain, Disp: 4 tablet, Rfl: 0   triamcinolone ointment (KENALOG) 0.1 %, Apply topically 2 (two) times daily as needed., Disp: , Rfl:   "

## 2025-01-18 ENCOUNTER — Ambulatory Visit: Admitting: Sports Medicine

## 2025-01-18 ENCOUNTER — Telehealth: Payer: Self-pay | Admitting: Sports Medicine

## 2025-01-18 VITALS — HR 73 | Ht 71.0 in | Wt 207.0 lb

## 2025-01-18 DIAGNOSIS — M48062 Spinal stenosis, lumbar region with neurogenic claudication: Secondary | ICD-10-CM | POA: Diagnosis not present

## 2025-01-18 DIAGNOSIS — Z9181 History of falling: Secondary | ICD-10-CM

## 2025-01-18 DIAGNOSIS — G8929 Other chronic pain: Secondary | ICD-10-CM | POA: Diagnosis not present

## 2025-01-18 NOTE — Patient Instructions (Signed)
 Epidural right L4-5   Follow up 2 weeks after to discuss results   Work note provided no lifting more than 15 pounds 4 weeks

## 2025-01-18 NOTE — Telephone Encounter (Signed)
 Currie Kenner from [employer] called and needs more specific information regarding work note they received. They need more information such as is this a return to work with restrictions note and with patient being a truck driver, is he able to operate a financial trader? Call back number is [phone]

## 2025-01-18 NOTE — Telephone Encounter (Signed)
 Called and spoke to patient. He is going to contact his employer to clarify what is needed.

## 2025-01-23 ENCOUNTER — Other Ambulatory Visit: Payer: Self-pay | Admitting: Medical

## 2025-01-23 NOTE — Telephone Encounter (Signed)
 Rx sent. Follow up in May. Please get him schedule.d

## 2025-02-20 ENCOUNTER — Ambulatory Visit: Admitting: Internal Medicine

## 2025-10-09 ENCOUNTER — Inpatient Hospital Stay

## 2025-10-09 ENCOUNTER — Inpatient Hospital Stay: Admitting: Medical Oncology

## 2025-10-16 ENCOUNTER — Ambulatory Visit
